# Patient Record
Sex: Female | Born: 1980 | Race: Black or African American | Hispanic: No | Marital: Single | Smoking: Former smoker
Health system: Southern US, Community
[De-identification: ages and names within clinical notes are randomized; demographics above are authoritative.]

## PROBLEM LIST (undated history)

## (undated) DIAGNOSIS — E039 Hypothyroidism, unspecified: Secondary | ICD-10-CM

## (undated) DIAGNOSIS — G4730 Sleep apnea, unspecified: Secondary | ICD-10-CM

## (undated) DIAGNOSIS — E669 Obesity, unspecified: Secondary | ICD-10-CM

## (undated) DIAGNOSIS — J189 Pneumonia, unspecified organism: Secondary | ICD-10-CM

## (undated) DIAGNOSIS — N939 Abnormal uterine and vaginal bleeding, unspecified: Secondary | ICD-10-CM

---

## 2011-09-24 HISTORY — PX: VAGINA SURGERY: SHX829

## 2012-12-07 ENCOUNTER — Encounter (HOSPITAL_COMMUNITY): Payer: Self-pay

## 2012-12-07 ENCOUNTER — Emergency Department (HOSPITAL_COMMUNITY): Payer: Medicaid Other

## 2012-12-07 ENCOUNTER — Inpatient Hospital Stay (HOSPITAL_COMMUNITY)
Admission: EM | Admit: 2012-12-07 | Discharge: 2012-12-12 | DRG: 193 | Disposition: A | Discharge status: Home / Self Care | Payer: Medicaid Other | Attending: Internal Medicine | Admitting: Internal Medicine

## 2012-12-07 DIAGNOSIS — E875 Hyperkalemia: Secondary | ICD-10-CM | POA: Diagnosis present

## 2012-12-07 DIAGNOSIS — G4733 Obstructive sleep apnea (adult) (pediatric): Secondary | ICD-10-CM

## 2012-12-07 DIAGNOSIS — J45901 Unspecified asthma with (acute) exacerbation: Secondary | ICD-10-CM | POA: Diagnosis present

## 2012-12-07 DIAGNOSIS — R600 Localized edema: Secondary | ICD-10-CM

## 2012-12-07 DIAGNOSIS — J9600 Acute respiratory failure, unspecified whether with hypoxia or hypercapnia: Secondary | ICD-10-CM | POA: Diagnosis present

## 2012-12-07 DIAGNOSIS — Z23 Encounter for immunization: Secondary | ICD-10-CM

## 2012-12-07 DIAGNOSIS — R197 Diarrhea, unspecified: Secondary | ICD-10-CM | POA: Diagnosis not present

## 2012-12-07 DIAGNOSIS — Z6841 Body Mass Index (BMI) 40.0 and over, adult: Secondary | ICD-10-CM

## 2012-12-07 DIAGNOSIS — R7989 Other specified abnormal findings of blood chemistry: Secondary | ICD-10-CM

## 2012-12-07 DIAGNOSIS — M7989 Other specified soft tissue disorders: Secondary | ICD-10-CM | POA: Diagnosis present

## 2012-12-07 DIAGNOSIS — J45909 Unspecified asthma, uncomplicated: Secondary | ICD-10-CM

## 2012-12-07 DIAGNOSIS — Z8701 Personal history of pneumonia (recurrent): Secondary | ICD-10-CM

## 2012-12-07 DIAGNOSIS — R1310 Dysphagia, unspecified: Secondary | ICD-10-CM | POA: Diagnosis present

## 2012-12-07 DIAGNOSIS — D72829 Elevated white blood cell count, unspecified: Secondary | ICD-10-CM

## 2012-12-07 DIAGNOSIS — Z79899 Other long term (current) drug therapy: Secondary | ICD-10-CM

## 2012-12-07 DIAGNOSIS — J189 Pneumonia, unspecified organism: Principal | ICD-10-CM | POA: Diagnosis present

## 2012-12-07 DIAGNOSIS — E662 Morbid (severe) obesity with alveolar hypoventilation: Secondary | ICD-10-CM | POA: Diagnosis present

## 2012-12-07 HISTORY — DX: Sleep apnea, unspecified: G47.30

## 2012-12-07 HISTORY — DX: Pneumonia, unspecified organism: J18.9

## 2012-12-07 HISTORY — DX: Abnormal uterine and vaginal bleeding, unspecified: N93.9

## 2012-12-07 HISTORY — DX: Obesity, unspecified: E66.9

## 2012-12-07 NOTE — ED Notes (Signed)
Pt reports SOB x 3 days. States she wears BiPap at night, and last night "it was just hurting my head. I just didn't feel like it was getting in." Pt states chest tightness x 3 days with NP cough x tonight. States hx of pneumonia, states it feels the same. SOB increases when laying flat and with exertion. Pt also reports increased swelling to bilateral legs x 1.5 weeks. Unable to appropriately assess dt pts girth. Pt also reports fall this evening when attempting to go to restroom. Denies LOC, injury. States boyfriend was able to help her stand up.

## 2012-12-07 NOTE — ED Provider Notes (Signed)
CSN: [id number]     Arrival date & time 12/07/12  2058 History   First MD Initiated Contact with Patient 12/07/12 2109     Chief Complaint  Patient presents with  . Shortness of Breath   (Consider location/radiation/quality/duration/timing/severity/associated sxs/prior Treatment) HPI Comments: Patient presents emergency department with chief complaint of shortness of breath. She states that she has had cough, and has been more short of breath than usual. She states that the symptoms have been ongoing for the past 3 days. She states that she normally wears oxygen at home. She also states that she felt like she had a syncopal episode today. She denies any chest pain. Denies fevers or chills. States the once when she felt like this, she was diagnosed with pneumonia.  The history is provided by the patient. No language interpreter was used.    Past Medical History  Diagnosis Date  . Sleep apnea   . Pneumonia   . Obesity    History reviewed. No pertinent past surgical history. Family History  Problem Relation Age of Onset  . Diabetes Mother   . Hypertension Mother   . Hypertension Father   . Diabetes Father    History  Substance Use Topics  . Smoking status: Never Smoker   . Smokeless tobacco: Not on file  . Alcohol Use: No   OB History   Grav Para Term Preterm Abortions TAB SAB Ect Mult Living   1 1        0     Review of Systems  All other systems reviewed and are negative.    Allergies  Fish allergy; Other; and Shrimp  Home Medications   Current Outpatient Rx  Name  Route  Sig  Dispense  Refill  . albuterol (PROVENTIL HFA;VENTOLIN HFA) 108 (90 BASE) MCG/ACT inhaler   Inhalation   Inhale 2 puffs into the lungs daily as needed for wheezing or shortness of breath.         . ferrous sulfate 325 (65 FE) MG tablet   Oral   Take 325 mg by mouth 3 (three) times daily with meals.         . furosemide (LASIX) 20 MG tablet   Oral   Take 20 mg by mouth daily.          . naproxen sodium (ANAPROX) 220 MG tablet   Oral   Take 220 mg by mouth daily as needed (pain).         . norethindrone (AYGESTIN) 5 MG tablet   Oral   Take 5 mg by mouth daily.         Marland Kitchen PRESCRIPTION MEDICATION   Topical   Apply 1 application topically 2 (two) times daily as needed (rash/dryness).         . traMADol (ULTRAM) 50 MG tablet   Oral   Take 150 mg by mouth 2 (two) times daily as needed for pain.         Marland Kitchen VITAMIN E PO   Oral   Take 1 capsule by mouth daily.          BP 131/63  Temp(Src) 98.3 F (36.8 C) (Oral)  Resp 20  Ht 5\' 4"  (1.626 m)  Wt 799 lb (362.424 kg)  BMI 137.08 kg/m2  SpO2 100%  LMP 12/07/2012 Physical Exam  Nursing note and vitals reviewed. Constitutional: She is oriented to person, place, and time. She appears well-developed and well-nourished.  HENT:  Head: Normocephalic and atraumatic.  Eyes: Conjunctivae  and EOM are normal. Pupils are equal, round, and reactive to light.  Neck: Normal range of motion. Neck supple.  Cardiovascular: Normal rate and regular rhythm.  Exam reveals no gallop and no friction rub.   No murmur heard. Pulmonary/Chest: Effort normal and breath sounds normal. No respiratory distress. She has no wheezes. She has no rales. She exhibits no tenderness.  Abdominal: Soft. Bowel sounds are normal. She exhibits no distension and no mass. There is no tenderness. There is no rebound and no guarding.  Musculoskeletal: Normal range of motion. She exhibits no edema and no tenderness.  Neurological: She is alert and oriented to person, place, and time.  Skin: Skin is warm and dry.  Psychiatric: She has a normal mood and affect. Her behavior is normal. Judgment and thought content normal.    ED Course  Procedures (including critical care time) Results for orders placed during the hospital encounter of 12/07/12  CBC WITH DIFFERENTIAL      Result Value Range   WBC 10.7 (*) 4.0 - 10.5 K/uL   RBC 4.47  3.87 - 5.11  MIL/uL   Hemoglobin 12.0  12.0 - 15.0 g/dL   HCT 16.1  09.6 - 04.5 %   MCV 91.3  78.0 - 100.0 fL   MCH 26.8  26.0 - 34.0 pg   MCHC 29.4 (*) 30.0 - 36.0 g/dL   RDW 40.9  81.1 - 91.4 %   Platelets 263  150 - 400 K/uL   Neutrophils Relative % 74  43 - 77 %   Neutro Abs 7.9 (*) 1.7 - 7.7 K/uL   Lymphocytes Relative 17  12 - 46 %   Lymphs Abs 1.8  0.7 - 4.0 K/uL   Monocytes Relative 6  3 - 12 %   Monocytes Absolute 0.6  0.1 - 1.0 K/uL   Eosinophils Relative 3  0 - 5 %   Eosinophils Absolute 0.3  0.0 - 0.7 K/uL   Basophils Relative 1  0 - 1 %   Basophils Absolute 0.1  0.0 - 0.1 K/uL  BASIC METABOLIC PANEL      Result Value Range   Sodium 138  135 - 145 mEq/L   Potassium 4.1  3.5 - 5.1 mEq/L   Chloride 100  96 - 112 mEq/L   CO2 32  19 - 32 mEq/L   Glucose, Bld 86  70 - 99 mg/dL   BUN 11  6 - 23 mg/dL   Creatinine, Ser 7.82  0.50 - 1.10 mg/dL   Calcium 8.9  8.4 - 95.6 mg/dL   GFR calc non Af Amer >90  >90 mL/min   GFR calc Af Amer >90  >90 mL/min  POCT I-STAT TROPONIN I      Result Value Range   Troponin i, poc 0.00  0.00 - 0.08 ng/mL   Comment 3            Dg Chest Port 1 View  12/07/2012   CLINICAL DATA:  Shortness of breath.  EXAM: PORTABLE CHEST - 1 VIEW  COMPARISON:  No priors.  FINDINGS: Film is underpenetrated, limiting the diagnostic sensitivity and specificity the examination. With these limitations in mind, there do appear to be patchy interstitial and multifocal airspace opacities throughout the lungs, most confluent in the right mid to lower lung and at the left base, concerning for potential multilobar pneumonia. Sequela of aspiration could have a similar appearance. No definite scratch at no definite pleural effusions. No evidence of pulmonary edema. Heart  size appears mildly enlarged. The patient is rotated to the left on today's exam, resulting in distortion of the mediastinal contours and reduced diagnostic sensitivity and specificity for mediastinal pathology.   IMPRESSION: 1. Multifocal patchy interstitial and airspace disease in the lung lungs, as above, concerning for multilobar pneumonia or sequela of aspiration. 2. Mild cardiomegaly.   Electronically Signed   By: Trudie Reed M.D.   On: 12/07/2012 22:48    Imaging Review Dg Chest Port 1 View  12/07/2012   CLINICAL DATA:  Shortness of breath.  EXAM: PORTABLE CHEST - 1 VIEW  COMPARISON:  No priors.  FINDINGS: Film is underpenetrated, limiting the diagnostic sensitivity and specificity the examination. With these limitations in mind, there do appear to be patchy interstitial and multifocal airspace opacities throughout the lungs, most confluent in the right mid to lower lung and at the left base, concerning for potential multilobar pneumonia. Sequela of aspiration could have a similar appearance. No definite scratch at no definite pleural effusions. No evidence of pulmonary edema. Heart size appears mildly enlarged. The patient is rotated to the left on today's exam, resulting in distortion of the mediastinal contours and reduced diagnostic sensitivity and specificity for mediastinal pathology.  IMPRESSION: 1. Multifocal patchy interstitial and airspace disease in the lung lungs, as above, concerning for multilobar pneumonia or sequela of aspiration. 2. Mild cardiomegaly.   Electronically Signed   By: Trudie Reed M.D.   On: 12/07/2012 22:48    MDM   1. CAP (community acquired pneumonia)    Morbidly obese patient with cough and shortness of breath. Chest x-ray remarkable for multifocal pneumonia. Will admit the patient to the hospital. I have started Rocephin and azithromycin. Patient has been seen by and discussed with Dr. Criss Alvine, who agrees with the plan.    Roxy Horseman, PA-C 12/08/12 [phone]

## 2012-12-07 NOTE — ED Notes (Signed)
Per EMS: Pt reports SOB when laying flat x 3 days with chest pain tightness. 99% 4L (wears at home). NAD. 149/66. 96 SR. Clear lung sounds. Hx: asthma, sleep apnea

## 2012-12-07 NOTE — ED Notes (Signed)
PA at bedside.

## 2012-12-08 ENCOUNTER — Encounter (HOSPITAL_COMMUNITY): Payer: Self-pay | Admitting: *Deleted

## 2012-12-08 DIAGNOSIS — G4733 Obstructive sleep apnea (adult) (pediatric): Secondary | ICD-10-CM | POA: Diagnosis present

## 2012-12-08 DIAGNOSIS — J45909 Unspecified asthma, uncomplicated: Secondary | ICD-10-CM | POA: Diagnosis present

## 2012-12-08 DIAGNOSIS — R600 Localized edema: Secondary | ICD-10-CM

## 2012-12-08 DIAGNOSIS — D72829 Elevated white blood cell count, unspecified: Secondary | ICD-10-CM

## 2012-12-08 DIAGNOSIS — J189 Pneumonia, unspecified organism: Secondary | ICD-10-CM | POA: Diagnosis present

## 2012-12-08 DIAGNOSIS — R7989 Other specified abnormal findings of blood chemistry: Secondary | ICD-10-CM

## 2012-12-08 DIAGNOSIS — M7989 Other specified soft tissue disorders: Secondary | ICD-10-CM

## 2012-12-08 LAB — CBC WITH DIFFERENTIAL/PLATELET
Basophils Absolute: 0.1 10*3/uL (ref 0.0–0.1)
Basophils Absolute: 0.1 10*3/uL (ref 0.0–0.1)
Basophils Relative: 1 % (ref 0–1)
Eosinophils Absolute: 0.3 10*3/uL (ref 0.0–0.7)
Eosinophils Relative: 3 % (ref 0–5)
HCT: 40.8 % (ref 36.0–46.0)
HCT: 41.6 % (ref 36.0–46.0)
Hemoglobin: 12.5 g/dL (ref 12.0–15.0)
Lymphocytes Relative: 10 % — ABNORMAL LOW (ref 12–46)
Lymphocytes Relative: 17 % (ref 12–46)
Lymphs Abs: 1.3 10*3/uL (ref 0.7–4.0)
MCHC: 29.4 g/dL — ABNORMAL LOW (ref 30.0–36.0)
MCV: 91.3 fL (ref 78.0–100.0)
Monocytes Absolute: 0.6 10*3/uL (ref 0.1–1.0)
Monocytes Absolute: 0.7 10*3/uL (ref 0.1–1.0)
Neutro Abs: 10.7 10*3/uL — ABNORMAL HIGH (ref 1.7–7.7)
Platelets: 263 10*3/uL (ref 150–400)
RBC: 4.56 MIL/uL (ref 3.87–5.11)
RDW: 14.4 % (ref 11.5–15.5)
RDW: 14.5 % (ref 11.5–15.5)
WBC: 10.7 10*3/uL — ABNORMAL HIGH (ref 4.0–10.5)
WBC: 13.1 10*3/uL — ABNORMAL HIGH (ref 4.0–10.5)

## 2012-12-08 LAB — COMPREHENSIVE METABOLIC PANEL
ALT: 19 U/L (ref 0–35)
AST: 17 U/L (ref 0–37)
CO2: 34 mEq/L — ABNORMAL HIGH (ref 19–32)
Chloride: 101 mEq/L (ref 96–112)
Creatinine, Ser: 0.7 mg/dL (ref 0.50–1.10)
GFR calc Af Amer: >90 mL/min (ref >90)
GFR calc non Af Amer: >90 mL/min (ref >90)
Glucose, Bld: 109 mg/dL — ABNORMAL HIGH (ref 70–99)
Total Bilirubin: 0.6 mg/dL (ref 0.3–1.2)

## 2012-12-08 LAB — PRO B NATRIURETIC PEPTIDE: Pro B Natriuretic peptide (BNP): 193.8 pg/mL — ABNORMAL HIGH (ref 0–125)

## 2012-12-08 LAB — BASIC METABOLIC PANEL
Calcium: 8.9 mg/dL (ref 8.4–10.5)
Creatinine, Ser: 0.66 mg/dL (ref 0.50–1.10)
GFR calc Af Amer: >90 mL/min (ref >90)
GFR calc non Af Amer: >90 mL/min (ref >90)
Sodium: 138 mEq/L (ref 135–145)

## 2012-12-08 LAB — D-DIMER, QUANTITATIVE: D-Dimer, Quant: 1.6 ug/mL-FEU — ABNORMAL HIGH (ref 0.00–0.48)

## 2012-12-08 LAB — POCT I-STAT TROPONIN I: Troponin i, poc: 0 ng/mL (ref 0.00–0.08)

## 2012-12-08 LAB — LEGIONELLA ANTIGEN, URINE

## 2012-12-08 LAB — CBC
HCT: 41.5 % (ref 36.0–46.0)
Hemoglobin: 12.7 g/dL (ref 12.0–15.0)
MCV: 90.2 fL (ref 78.0–100.0)
RBC: 4.6 MIL/uL (ref 3.87–5.11)
WBC: 15.3 10*3/uL — ABNORMAL HIGH (ref 4.0–10.5)

## 2012-12-08 MED ORDER — PATIENT'S GUIDE TO USING COUMADIN BOOK
Freq: Once | Status: DC
  Filled 2012-12-08: qty 1

## 2012-12-08 MED ORDER — ACETAMINOPHEN 325 MG PO TABS
650.0000 mg | ORAL_TABLET | Freq: Four times a day (QID) | ORAL | Status: DC | PRN
  Administered 2012-12-09 – 2012-12-11 (×2): 650 mg via ORAL
  Filled 2012-12-08 (×2): qty 2

## 2012-12-08 MED ORDER — ALBUTEROL SULFATE HFA 108 (90 BASE) MCG/ACT IN AERS: 2.0000 | INHALATION_SPRAY | Freq: Every day | RESPIRATORY_TRACT | Status: DC | PRN

## 2012-12-08 MED ORDER — METHYLPREDNISOLONE SODIUM SUCC 125 MG IJ SOLR
60.0000 mg | Freq: Two times a day (BID) | INTRAMUSCULAR | Status: DC
  Administered 2012-12-08 – 2012-12-10 (×4): 60 mg via INTRAVENOUS
  Filled 2012-12-08 (×5): qty 0.96
  Filled 2012-12-08: qty 2
  Filled 2012-12-08: qty 0.96

## 2012-12-08 MED ORDER — FERROUS SULFATE 325 (65 FE) MG PO TABS
325.0000 mg | ORAL_TABLET | Freq: Three times a day (TID) | ORAL | Status: DC
  Administered 2012-12-08 – 2012-12-12 (×14): 325 mg via ORAL
  Filled 2012-12-08 (×16): qty 1

## 2012-12-08 MED ORDER — PATIENT'S GUIDE TO USING COUMADIN BOOK
Freq: Once | Status: DC
  Administered 2012-12-08: 15:00:00
  Filled 2012-12-08: qty 1

## 2012-12-08 MED ORDER — DEXTROSE 5 % IV SOLN
1.0000 g | INTRAVENOUS | Status: DC
  Administered 2012-12-08 – 2012-12-09 (×2): 1 g via INTRAVENOUS
  Filled 2012-12-08 (×3): qty 10

## 2012-12-08 MED ORDER — ALBUTEROL SULFATE (5 MG/ML) 0.5% IN NEBU
2.5000 mg | INHALATION_SOLUTION | RESPIRATORY_TRACT | Status: DC
  Administered 2012-12-08 – 2012-12-11 (×15): 2.5 mg via RESPIRATORY_TRACT
  Filled 2012-12-08 (×23): qty 0.5

## 2012-12-08 MED ORDER — ENOXAPARIN SODIUM 150 MG/ML ~~LOC~~ SOLN
300.0000 mg | Freq: Two times a day (BID) | SUBCUTANEOUS | Status: DC
  Filled 2012-12-08 (×2): qty 2

## 2012-12-08 MED ORDER — POTASSIUM CHLORIDE CRYS ER 20 MEQ PO TBCR
40.0000 meq | EXTENDED_RELEASE_TABLET | Freq: Every day | ORAL | Status: DC
Start: 2012-12-09 — End: 2012-12-09
  Filled 2012-12-08: qty 2

## 2012-12-08 MED ORDER — TRAMADOL HCL 50 MG PO TABS
100.0000 mg | ORAL_TABLET | Freq: Four times a day (QID) | ORAL | Status: DC | PRN
  Administered 2012-12-08: 100 mg via ORAL

## 2012-12-08 MED ORDER — TRAMADOL HCL 50 MG PO TABS
150.0000 mg | ORAL_TABLET | Freq: Two times a day (BID) | ORAL | Status: DC | PRN
  Filled 2012-12-08: qty 3

## 2012-12-08 MED ORDER — FUROSEMIDE 40 MG PO TABS: 40.0000 mg | ORAL_TABLET | Freq: Every day | ORAL | Status: DC

## 2012-12-08 MED ORDER — DEXTROSE 5 % IV SOLN
500.0000 mg | Freq: Once | INTRAVENOUS | Status: AC
  Administered 2012-12-08: 500 mg via INTRAVENOUS
  Filled 2012-12-08: qty 500

## 2012-12-08 MED ORDER — ENOXAPARIN SODIUM 150 MG/ML ~~LOC~~ SOLN
300.0000 mg | Freq: Two times a day (BID) | SUBCUTANEOUS | Status: DC
  Administered 2012-12-08 – 2012-12-10 (×4): 300 mg via SUBCUTANEOUS
  Filled 2012-12-08 (×6): qty 2

## 2012-12-08 MED ORDER — FUROSEMIDE 10 MG/ML IJ SOLN
40.0000 mg | Freq: Two times a day (BID) | INTRAMUSCULAR | Status: DC
  Administered 2012-12-08 – 2012-12-11 (×5): 40 mg via INTRAVENOUS
  Filled 2012-12-08 (×7): qty 4

## 2012-12-08 MED ORDER — WARFARIN VIDEO: Freq: Once | Status: DC

## 2012-12-08 MED ORDER — ALBUTEROL SULFATE (5 MG/ML) 0.5% IN NEBU: 2.5000 mg | INHALATION_SOLUTION | RESPIRATORY_TRACT | Status: DC | PRN

## 2012-12-08 MED ORDER — AZITHROMYCIN 500 MG PO TABS
500.0000 mg | ORAL_TABLET | Freq: Every day | ORAL | Status: AC
  Administered 2012-12-09 – 2012-12-10 (×2): 500 mg via ORAL
  Filled 2012-12-08 (×2): qty 1

## 2012-12-08 MED ORDER — DEXTROSE 5 % IV SOLN: 500.0000 mg | INTRAVENOUS | Status: DC

## 2012-12-08 MED ORDER — FUROSEMIDE 20 MG PO TABS
20.0000 mg | ORAL_TABLET | Freq: Every day | ORAL | Status: DC
  Administered 2012-12-08: 12:00:00 20 mg via ORAL
  Filled 2012-12-08: qty 1

## 2012-12-08 MED ORDER — ENOXAPARIN (LOVENOX) PATIENT EDUCATION KIT
PACK | Freq: Once | Status: DC
  Filled 2012-12-08: qty 1

## 2012-12-08 MED ORDER — NORETHINDRONE ACETATE 5 MG PO TABS
5.0000 mg | ORAL_TABLET | Freq: Every day | ORAL | Status: DC
  Filled 2012-12-08: qty 1

## 2012-12-08 MED ORDER — DEXTROSE 5 % IV SOLN
1.0000 g | Freq: Once | INTRAVENOUS | Status: AC
  Administered 2012-12-08: 1 g via INTRAVENOUS
  Filled 2012-12-08: qty 10

## 2012-12-08 MED ORDER — IPRATROPIUM BROMIDE 0.02 % IN SOLN
0.5000 mg | RESPIRATORY_TRACT | Status: DC
  Administered 2012-12-08 – 2012-12-11 (×15): 0.5 mg via RESPIRATORY_TRACT
  Filled 2012-12-08 (×23): qty 2.5

## 2012-12-08 MED ORDER — FUROSEMIDE 20 MG PO TABS
20.0000 mg | ORAL_TABLET | Freq: Once | ORAL | Status: DC
  Administered 2012-12-08: 20 mg via ORAL
  Filled 2012-12-08: qty 1

## 2012-12-08 MED ORDER — HEPARIN SODIUM (PORCINE) 5000 UNIT/ML IJ SOLN
5000.0000 [IU] | Freq: Three times a day (TID) | INTRAMUSCULAR | Status: DC
  Administered 2012-12-08: 09:00:00 5000 [IU] via SUBCUTANEOUS
  Filled 2012-12-08 (×4): qty 1

## 2012-12-08 NOTE — Progress Notes (Addendum)
[facility] PROGRESS NOTE  Isabel Roberts NFA:[id number] DOB: 1981-03-11 DOA: 12/07/2012 PCP: [name], MD  Assessment/Plan  Acute hypoxic respiratory failure. Differential diagnosis includes community acquired pneumonia, pulmonary edema, pulmonary embolism.  She has had lower extremity swelling suggesting right vs. Left heart failure.  Pro-BNP often falsely low in setting of obesity. D-dimer moderately elevated.  Unable to complete confirmatory testing due to body habitus.  Spoke with [facility] who do not have equipment to perform CT or VQ for weight > 550lbs.    CAP -  S. pneumo and legionella neg -  Continue azithromycin 500mg  daily x 3 days -  Continue ceftriaxone  Acute asthma exacerbation -  Continue solumedrol today and wean to prednisone tomorrow if tolerated -  Continue duonebs q4h and add albuterol neb q2h prn  Elevated D-dimer -  Start empiric A/C for minimum of 3 months -  Lovenox teaching -  Coumadin teaching -  INR to be managed by Dr. Redmond School, her PCP -  Home health RN to draw INR/CBC and fax to Dr. Pilar Grammes office, assist with lovenox injections -  D/c birth control  OSA/OHS -  Continue nightly bipap  Lower extremity swelling.  DDx includes diastolic heart failure, right ventricular failure secondary to asthma, OHS/OSA, dvt -  ECHO  -  Lower extremity duplex -  Strict I/O -  Lasix 40mg  IV BID   Morbid obesity -  Nursing staff and others working on obtaining bedside commode and rolling walker for patient to use -  Nutrition consultation -  Once bedside commode established, please d/c foley -  PT evaluation  Hx of vaginal bleeding -  Monitor clinically -  CBC monitoring   Leukocytosis, rising likely secondary to steroids -  Trend  Diet:  Dysphagia 3 with thin liquids Access:  PIV IVF:  OFF Proph:  Therapeutic lovenox  Code Status: full Family Communication: patient alone Disposition Plan: pending improvement in  dyspnea   Consultants:  Pharmacy  Procedures:  CXR  Duplex lower extremity  Antibiotics:  Ceftriaxone 9/15  Azithromycin 9/15   HPI/Subjective:  Patient states that her breathing has become much easier since she came to the emergency department.  She is now back to baseline, however she has less wheeze and less shortness of breath.  Objective: Filed Vitals:   12/08/12 0400 12/08/12 0510 12/08/12 0638 12/08/12 1333  BP:  133/49    Pulse:  99    Temp:  98.5 F (36.9 C)    TempSrc:  Oral    Resp:  22    Height:  5\' 4"  (1.626 m)    Weight:      SpO2: 94% 94% 98% 97%    Intake/Output Summary (Last 24 hours) at 12/08/12 1438 Last data filed at 12/08/12 1300  Gross per 24 hour  Intake    740 ml  Output    400 ml  Net    340 ml   Filed Weights   12/07/12 2212  Weight: 362.424 kg (799 lb)    Exam:   General:  Morbidly obese African American female, No acute distress, nasal cannula in place  HEENT:  NCAT, MMM  Cardiovascular:  RRR, nl S1, S2 no mrg, 2+ pulses, warm extremities  Respiratory:  Expiratory wheeze, diminished, and no audible focal rales or rhonchi, no increased WOB  Abdomen:   NABS, soft, NT/ND  MSK:   Normal tone and bulk, 1+ bilateral LEE  Neuro:  Grossly intact  Data Reviewed: Basic Metabolic Panel:  Recent Labs Lab 12/08/12 0005 12/08/12 0740  NA 138 140  K 4.1 4.2  CL 100 101  CO2 32 34*  GLUCOSE 86 109*  BUN 11 11  CREATININE 0.66 0.70  CALCIUM 8.9 9.1   Liver Function Tests:  Recent Labs Lab 12/08/12 0740  AST 17  ALT 19  ALKPHOS 148*  BILITOT 0.6  PROT 7.3  ALBUMIN 3.5   No results found for this basename: LIPASE, AMYLASE,  in the last 168 hours No results found for this basename: AMMONIA,  in the last 168 hours CBC:  Recent Labs Lab 12/08/12 0005 12/08/12 0740  WBC 10.7* 13.1*  NEUTROABS 7.9* 10.7*  HGB 12.0 12.5  HCT 40.8 41.6  MCV 91.3 91.2  PLT 263 246   Cardiac Enzymes: No results found for  this basename: CKTOTAL, CKMB, CKMBINDEX, TROPONINI,  in the last 168 hours BNP (last 3 results)  Recent Labs  12/08/12 0005  PROBNP 193.8*   CBG: No results found for this basename: GLUCAP,  in the last 168 hours  No results found for this or any previous visit (from the past 240 hour(s)).   Studies: Dg Chest Port 1 View  12/07/2012   CLINICAL DATA:  Shortness of breath.  EXAM: PORTABLE CHEST - 1 VIEW  COMPARISON:  No priors.  FINDINGS: Film is underpenetrated, limiting the diagnostic sensitivity and specificity the examination. With these limitations in mind, there do appear to be patchy interstitial and multifocal airspace opacities throughout the lungs, most confluent in the right mid to lower lung and at the left base, concerning for potential multilobar pneumonia. Sequela of aspiration could have a similar appearance. No definite scratch at no definite pleural effusions. No evidence of pulmonary edema. Heart size appears mildly enlarged. The patient is rotated to the left on today's exam, resulting in distortion of the mediastinal contours and reduced diagnostic sensitivity and specificity for mediastinal pathology.  IMPRESSION: 1. Multifocal patchy interstitial and airspace disease in the lung lungs, as above, concerning for multilobar pneumonia or sequela of aspiration. 2. Mild cardiomegaly.   Electronically Signed   By: Trudie Reed M.D.   On: 12/07/2012 22:48    Scheduled Meds: . ipratropium  0.5 mg Nebulization Q4H   And  . albuterol  2.5 mg Nebulization Q4H  . [START ON 12/09/2012] azithromycin  500 mg Oral Daily  . cefTRIAXone (ROCEPHIN)  IV  1 g Intravenous Q24H  . enoxaparin (LOVENOX) injection  300 mg Subcutaneous Q12H  . ferrous sulfate  325 mg Oral TID WC  . furosemide  40 mg Intravenous BID  . methylPREDNISolone (SOLU-MEDROL) injection  60 mg Intravenous Q12H  . patient's guide to using coumadin book   Does not apply Once  . [START ON 12/09/2012] potassium chloride   40 mEq Oral Daily  . warfarin   Does not apply Once   Continuous Infusions:   Principal Problem:   CAP (community acquired pneumonia) Active Problems:   Morbid obesity   OSA (obstructive sleep apnea)   Asthma    Time spent: 30 min    Kimoni Pickerill, [facility]  [facility] Pager [phone]. If 7PM-7AM, please contact night-coverage at [email or web address], password [facility] 12/08/2012, 2:38 PM  LOS: 1 day

## 2012-12-08 NOTE — Evaluation (Signed)
Clinical/Bedside Swallow Evaluation Patient Details  Name: Isabel Roberts MRN: [id number] Date of Birth: November 03, 1980  Today's Date: 12/08/2012 Time: 5366-4403 SLP Time Calculation (min): 45 min  Past Medical History:  Past Medical History  Diagnosis Date  . Sleep apnea   . Pneumonia   . Obesity   . Vaginal bleeding    Past Surgical History: History reviewed. No pertinent past surgical history. HPI:  Pt is a 32 y.o. female with Past medical history of morbid obesity, obstructive sleep apnea on BiPAP at home, recurrent pneumonias, history of asthma as a child on inhalers she presented today with the complaint of shortness of breath that has been progressively worsening since last few days. She mentions that she normally wears BiPAP along with oxygen but since last 3 days due to shortness of breath she has been using her oxygen even without BiPAP during days. She denies any chest pain or fever or chills but does mention that she has cough along with sputum expectoration which he mentions white to yellow without any blood. She felt nauseous but denies any vomiting or abdominal pain. She denies any urinary symptoms or constipation. She feels that her legs are more swollen than her usual. She mentions that she has been ambulating at home appropriately with a walker but since last few days has been having lethargy and had almost fall when she felt dizzy and lightheaded today. She denies any trauma to her head or neck. She denies any episode of loss of consciousness. CXR 12/07/12 is concerning for  multilobar pneumonia and a bedside swallowing evaluation was ordered.   Assessment / Plan / Recommendation Clinical Impression  Pt presents with an intermittent, delayed throat clear during PO trials which may be secondary to pt's reported h/o reflux. Eructation also noted, and vocal quality remains clear throughout. Overt coughing x1 observed immediately following a straw sip of water with pt reporting, "that's  my fault, I tried to start talking while I was swallowing." Pt's oropharyngeal function otherwise appears grossly WFL, however SLP provided education regarding pt's respiratory status and positioning (due to size) may increase her risk of possible penetration/aspiration. Recommend to initiate a Dys. 3 diet for energy conservation with thin liquids with frequent breaks due to SOB. Patient positioning will be challenging although very important for safe swallowing; pt must be completely upright for all PO intake.    Aspiration Risk  Mild    Diet Recommendation Dysphagia 3 (Mechanical Soft);Thin liquid   Liquid Administration via: Cup;Straw;No straw Medication Administration: Whole meds with liquid Supervision: Patient able to self feed;Intermittent supervision to cue for compensatory strategies Compensations: Slow rate;Small sips/bites;Follow solids with liquid Postural Changes and/or Swallow Maneuvers: Seated upright 90 degrees;Upright 30-60 min after meal    Other  Recommendations Oral Care Recommendations: Oral care BID   Follow Up Recommendations  Outpatient SLP    Frequency and Duration min 2x/week  2 weeks   Pertinent Vitals/Pain N/A    SLP Swallow Goals Patient will utilize recommended strategies during swallow to increase swallowing safety with: Modified independent assistance   Swallow Study Prior Functional Status       General Date of Onset: 12/07/12 HPI: Pt is a 32 y.o. female with Past medical history of morbid obesity, obstructive sleep apnea on BiPAP at home, recurrent pneumonias, history of asthma as a child on inhalers she presented today with the complaint of shortness of breath that has been progressively worsening since last few days. She mentions that she normally wears BiPAP along with  oxygen but since last 3 days due to shortness of breath she has been using her oxygen even without BiPAP during days. She denies any chest pain or fever or chills but does mention  that she has cough along with sputum expectoration which he mentions white to yellow without any blood. She felt nauseous but denies any vomiting or abdominal pain. She denies any urinary symptoms or constipation. She feels that her legs are more swollen than her usual. She mentions that she has been ambulating at home appropriately with a walker but since last few days has been having lethargy and had almost fall when she felt dizzy and lightheaded today. She denies any trauma to her head or neck. She denies any episode of loss of consciousness. CXR 12/07/12 is concerning for  multilobar pneumonia and a bedside swallowing evaluation was ordered. Type of Study: Bedside swallow evaluation Previous Swallow Assessment: none in chart Diet Prior to this Study: NPO Temperature Spikes Noted: No Respiratory Status: Other (comment) (pt on BiPAP, removed herself for evaluation) History of Recent Intubation: No Behavior/Cognition: Alert;Cooperative;Pleasant mood Oral Cavity - Dentition: Adequate natural dentition Self-Feeding Abilities: Able to feed self Patient Positioning: Upright in bed Baseline Vocal Quality: Clear Volitional Cough: Strong Volitional Swallow: Able to elicit    Oral/Motor/Sensory Function Overall Oral Motor/Sensory Function: Appears within functional limits for tasks assessed   Ice Chips Ice chips: Not tested   Thin Liquid Thin Liquid: Impaired Presentation: Cup;Self Fed;Straw Pharyngeal  Phase Impairments: Throat Clearing - Delayed;Cough - Immediate    Nectar Thick Nectar Thick Liquid: Not tested   Honey Thick Honey Thick Liquid: Not tested   Puree Puree: Impaired Presentation: Self Fed;Spoon Pharyngeal Phase Impairments: Throat Clearing - Delayed   Solid   GO Functional Assessment Tool Used: clinical judgment Functional Limitations: Swallowing Swallow Current Status (B1478): At least 20 percent but less than 40 percent impaired, limited or restricted Swallow Goal Status  [phone]): At least 1 percent but less than 20 percent impaired, limited or restricted  Solid: Within functional limits Presentation: Self Georjean Mode 12/08/2012,10:48 AM   Maxcine Ham, M.A. CCC-SLP [phone]

## 2012-12-08 NOTE — Progress Notes (Addendum)
ANTICOAGULATION CONSULT NOTE - Initial Consult  Pharmacy Consult for Lovenox Indication:  Elevated D-Dimer - Possible DVT/PE  Allergies  Allergen Reactions  . Fish Allergy Hives and Other (See Comments)    Throat swells  . Other Other (See Comments)    Grass causes a rash  . Shrimp [Shellfish Allergy] Rash   Patient Measurements: Weight - 362.4 kg  Vital Signs: Temp: 98.5 F (36.9 C) (09/15 0510) Temp src: Oral (09/15 0510) BP: 133/49 mmHg (09/15 0510) Pulse Rate: 99 (09/15 0510)  Labs:  Recent Labs  12/08/12 0005 12/08/12 0740  HGB 12.0 12.5  HCT 40.8 41.6  PLT 263 246  CREATININE 0.66 0.70   Estimated Creatinine Clearance: 288.6 ml/min (by C-G formula based on Cr of 0.7).  Medical History: Past Medical History  Diagnosis Date  . Sleep apnea   . Pneumonia   . Obesity   . Vaginal bleeding    Assessment: 32 yo female with c/o shortness of breath and swelling in the lower extremities.  She has an elevated D-Dimer and is being evaluated for possible DVT/PE.  We have been asked to initiate SQ LMWH for her until work up is complete.  She has been on Aygestin which has the potential to increase her risk of thrombosis.  This has now been discontinued.  She is morbidly obese with an estimated weight of 362 kg.  She has some ongoing vaginal bleeding and we will need to watch this closely.  Typical dosing does not require a cap for weight, however, we will initiate therapy with 300 mg LMWH every 12 hours and draw a 4 hour anti-Xa level to see if she falls within desired goal range.  Her CBC is stable as well as her platelets.  Her renal function is also stable.  Please note: Norethindrone Acetate ( Aygestin) Contraindicated in Cardiovascular Disorders and Venous Thromboembolism The FDA approved safety labeling revisions for norethindrone acetate tablets ( Aygestin; [employer]) to reflect additional contraindications for use. Norethindrone is contraindicated  in women with known, suspected, or a history of breast cancer and in those with active deep vein thrombosis, pulmonary embolism, or a history of these conditions. Women who have active arterial thromboembolic disease or have had a thromboembolic event such as a stroke or myocardial infarction within the past year should not receive norethindrone therapy. Also, patients with risk factors for arterial vascular disease (eg, hypertension, diabetes mellitus, tobacco use, hypercholesterolemia, and obesity) and/or venous thromboembolism (eg, personal or family history of the condition, obesity, and systemic lupus erythematosus) should be treated appropriately. Norethindrone tablets are indicated for the treatment of secondary amenorrhea, endometriosis, and abnormal uterine bleeding from hormonal imbalance in the absence of organic pathologic causes, such as submucous fibroids or uterine cancer.   Goal of Therapy:  Anti-Xa level 0.6-1.2 units/ml 4hrs after LMWH dose given Monitor platelets by anticoagulation protocol: Yes   Plan:  Lovenox 300mg  SQ q12 hours Obtain a 4 hour Anti-Xa level and adjust dose if she is not within desired goal range.  Nadara Mustard, PharmD., MS Clinical Pharmacist Pager:  [phone] Thank you for allowing pharmacy to be part of this patients care team. 12/08/2012,2:15 PM

## 2012-12-08 NOTE — Progress Notes (Signed)
VASCULAR LAB PRELIMINARY  PRELIMINARY  PRELIMINARY  PRELIMINARY  Bilateral lower extremity venous attempted.   Inconclusive due to limitations of ultrasound transmission and patients body habitus.    Preliminary report:    Florestine Avers, RVT 12/08/2012, 5:29 PM

## 2012-12-08 NOTE — ED Notes (Signed)
RT notified of breathing treatments ordered for 4am

## 2012-12-08 NOTE — Progress Notes (Signed)
MEDICATION RELATED NOTE   Pharmacy Consult for Renal Dose Adjustment of ABX  Allergies  Allergen Reactions  . Fish Allergy Hives and Other (See Comments)    Throat swells  . Other Other (See Comments)    Grass causes a rash  . Shrimp [Shellfish Allergy] Rash   Patient Measurements: [facility] Weights   12/07/12 2212  Weight: 799 lb (362.424 kg)   Vital Signs: Temp: 98.5 F (36.9 C) (09/15 0510) Temp src: Oral (09/15 0510) BP: 133/49 mmHg (09/15 0510) Pulse Rate: 99 (09/15 0510)  Labs:  Recent Labs  12/08/12 0005 12/08/12 0740  WBC 10.7* 13.1*  HGB 12.0 12.5  HCT 40.8 41.6  PLT 263 246  CREATININE 0.66 0.70  ALBUMIN  --  3.5  PROT  --  7.3  AST  --  17  ALT  --  19  ALKPHOS  --  148*  BILITOT  --  0.6   Estimated Creatinine Clearance: 288.6 ml/min (by C-G formula based on Cr of 0.7).   Microbiology: No results found for this or any previous visit (from the past 720 hour(s)).  Medications:  Anti-infectives   Start     Dose/Rate Route Frequency Ordered Stop   12/09/12 0000  azithromycin (ZITHROMAX) 500 mg in dextrose 5 % 250 mL IVPB     500 mg 250 mL/hr over 60 Minutes Intravenous Every 24 hours 12/08/12 0502 12/15/12 2359   12/08/12 2200  cefTRIAXone (ROCEPHIN) 1 g in dextrose 5 % 50 mL IVPB     1 g 100 mL/hr over 30 Minutes Intravenous Every 24 hours 12/08/12 0502 12/15/12 2159   12/08/12 0045  cefTRIAXone (ROCEPHIN) 1 g in dextrose 5 % 50 mL IVPB     1 g 100 mL/hr over 30 Minutes Intravenous  Once 12/08/12 0032 12/08/12 0113   12/08/12 0045  azithromycin (ZITHROMAX) 500 mg in dextrose 5 % 250 mL IVPB     500 mg 250 mL/hr over 60 Minutes Intravenous  Once 12/08/12 0032 12/08/12 0256      Assessment: 32 yo female admitted with complaints of shortness of breath.  She has a PMH significant for morbid obesity, OSA which requires the use of a BiPAP.  She has recurrent respiratory infections and a history of asthma with inhaler use.  She is currently on 4L [place].   She is receiving IV Ceftriaxone and Azithromycin for possible pneumonia per cxr.  We have been asked to evaluate her regimen for possible dose adjustments based on her renal function.  Goal of Therapy:  Therapeutic response to IV antibiotics  Plan:  1.  Continue current antibiotics as ordered - dose is appropriate for renal function. 2.  Consider changing antibiotics based on culture data and sensitivity when available.  Nadara Mustard, PharmD., MS Clinical Pharmacist Pager:  [phone] Thank you for allowing pharmacy to be part of this patients care team. 12/08/2012,11:54 AM

## 2012-12-08 NOTE — H&P (Signed)
[facility] History and Physical  Patient: Isabel Roberts  ZOX:[id number]  DOB: 05-23-1980  DOA: 12/07/2012  Referring physician: Dr. Criss Alvine PCP: [name], MD  Consults:   non-  Chief Complaint: Cough with shortness of breath  HPI: Isabel Roberts is a 32 y.o. female with Past medical history of morbid obesity, obstructive sleep apnea on BiPAP at home, recurrent pneumonias, history of asthma as a child on inhalers she presented today with the complaint of shortness of breath that has been progressively worsening since last few days. She mentions that she normally wears BiPAP along with oxygen but since last 3 days due to shortness of breath she has been using her oxygen even without BiPAP during days. She denies any chest pain or fever or chills but does mention that she has cough along with sputum expectoration which he mentions white to yellow without any blood. She felt nauseous but denies any vomiting or abdominal pain. She denies any urinary symptoms or constipation. She feels that her legs are more swollen than her usual. She mentions that she has been ambulating at home appropriately with a walker but since last few days has been having lethargy and had almost fall when she felt dizzy and lightheaded today. She denies any trauma to her head or neck. She denies any episode of loss of consciousness.  Review of Systems: as mentioned in the history of present illness.  A Comprehensive review of the other systems is negative.  Past Medical History  Diagnosis Date  . Sleep apnea   . Pneumonia   . Obesity    History reviewed. No pertinent past surgical history. Social History:  reports that she has never smoked. She does not have any smokeless tobacco history on file. She reports that she does not drink alcohol or use illicit drugs. Patient is coming from home. Independent for most of her  ADL.  Allergies  Allergen Reactions  . Fish Allergy Hives and Other (See Comments)     Throat swells  . Other Other (See Comments)    Grass causes a rash  . Shrimp [Shellfish Allergy] Rash    Family History  Problem Relation Age of Onset  . Diabetes Mother   . Hypertension Mother   . Hypertension Father   . Diabetes Father     Prior to Admission medications   Medication Sig Start Date End Date Taking? Authorizing Provider  albuterol (PROVENTIL HFA;VENTOLIN HFA) 108 (90 BASE) MCG/ACT inhaler Inhale 2 puffs into the lungs daily as needed for wheezing or shortness of breath.   Yes Historical Provider, MD  ferrous sulfate 325 (65 FE) MG tablet Take 325 mg by mouth 3 (three) times daily with meals.   Yes Historical Provider, MD  furosemide (LASIX) 20 MG tablet Take 20 mg by mouth daily.   Yes Historical Provider, MD  naproxen sodium (ANAPROX) 220 MG tablet Take 220 mg by mouth daily as needed (pain).   Yes Historical Provider, MD  norethindrone (AYGESTIN) 5 MG tablet Take 5 mg by mouth daily.   Yes Historical Provider, MD  PRESCRIPTION MEDICATION Apply 1 application topically 2 (two) times daily as needed (rash/dryness).   Yes Historical Provider, MD  traMADol (ULTRAM) 50 MG tablet Take 150 mg by mouth 2 (two) times daily as needed for pain.   Yes Historical Provider, MD  VITAMIN E PO Take 1 capsule by mouth daily.   Yes Historical Provider, MD    Physical Exam: Filed Vitals:   12/07/12 2212 12/07/12 2245 12/08/12  0008 12/08/12 0400  BP:  104/64 137/65   Pulse:  83 91   Temp:      TempSrc:      Resp:  28 25   Height: 5\' 4"  (1.626 m)     Weight: 362.424 kg (799 lb)     SpO2:  98% 97% 94%    General: Alert, Awake and Oriented to Time, Place and Person. Appear in moderate distress Eyes: PERRL ENT: Oral Mucosa clear moist. Neck: Difficult to assess  JVD, no  Carotid Bruits  Cardiovascular: S1 and S2 Present, no  Murmur, Peripheral Pulses Present Respiratory: Bilateral Air entry equal and Decreased, bilateral faint distant  Crackles,expiratory wheezes in the neck and  upper chest  Abdomen: Bowel Sound Present, Soft and Non tender Skin: No  Rash Extremities: Bilateral  Pedal edema, no  calf tenderness Neurologic: Grossly Unremarkable.  Labs on Admission:  CBC:  Recent Labs Lab 12/08/12 0005  WBC 10.7*  NEUTROABS 7.9*  HGB 12.0  HCT 40.8  MCV 91.3  PLT 263    CMP     Component Value Date/Time   NA 138 12/08/2012 0005   K 4.1 12/08/2012 0005   CL 100 12/08/2012 0005   CO2 32 12/08/2012 0005   GLUCOSE 86 12/08/2012 0005   BUN 11 12/08/2012 0005   CREATININE 0.66 12/08/2012 0005   CALCIUM 8.9 12/08/2012 0005   GFRNONAA >90 12/08/2012 0005   GFRAA >90 12/08/2012 0005    No results found for this basename: LIPASE, AMYLASE,  in the last 168 hours No results found for this basename: AMMONIA,  in the last 168 hours  Cardiac Enzymes: No results found for this basename: CKTOTAL, CKMB, CKMBINDEX, TROPONINI,  in the last 168 hours  BNP (last 3 results)  Recent Labs  12/08/12 0005  PROBNP 193.8*    Radiological Exams on Admission: Dg Chest Port 1 View  12/07/2012   CLINICAL DATA:  Shortness of breath.  EXAM: PORTABLE CHEST - 1 VIEW  COMPARISON:  No priors.  FINDINGS: Film is underpenetrated, limiting the diagnostic sensitivity and specificity the examination. With these limitations in mind, there do appear to be patchy interstitial and multifocal airspace opacities throughout the lungs, most confluent in the right mid to lower lung and at the left base, concerning for potential multilobar pneumonia. Sequela of aspiration could have a similar appearance. No definite scratch at no definite pleural effusions. No evidence of pulmonary edema. Heart size appears mildly enlarged. The patient is rotated to the left on today's exam, resulting in distortion of the mediastinal contours and reduced diagnostic sensitivity and specificity for mediastinal pathology.  IMPRESSION: 1. Multifocal patchy interstitial and airspace disease in the lung lungs, as above,  concerning for multilobar pneumonia or sequela of aspiration. 2. Mild cardiomegaly.   Electronically Signed   By: Trudie Reed M.D.   On: 12/07/2012 22:48   Assessment/Plan Principal Problem:   CAP (community acquired pneumonia) Active Problems:   Morbid obesity   OSA (obstructive sleep apnea)   Asthma   1. CAP (community acquired pneumonia) The patient had a complaint of cough and shortness of breath. She underwent a chest x-ray that is suggestive of multilobar pneumonia. She is on 4 L of oxygen which is her baseline. She appears in respiratory distress. She does have history of asthma as a child and is on using inhalers. She appears to have expiratory wheezes on examination as well. With this I would admit the patient and treat her with IV ceftriaxone  and IV Zithromax. Considering her history of asthma and respiratory distress I would also treat her with IV Solu-Medrol and DuoNeb's. Blood cultures and sputum cultures are obtained, urine antigens will be ordered. Patient will be admitted to telemetry.  2. Morbid obesity bariatric bed has been arranged  3. Obstructive sleep apnea Patient will be placed on BiPAP at her home settings.  DVT Prophylaxis: subcutaneous Heparin Nutrition: As tolerated   Code Status: Full   Disposition: Admitted to inpatient in telemetry.  Author: Lynden Oxford, MD [facility] Pager: [phone] 12/08/2012, 5:02 AM    If 7PM-7AM, please contact night-coverage [email or web address] Password [email or web address]

## 2012-12-08 NOTE — ED Provider Notes (Signed)
Medical screening examination/treatment/procedure(s) were conducted as a shared visit with non-physician practitioner(s) and myself.  I personally evaluated the patient during the encounter   Patient massively obese with dyspnea and cough with subjective fevers. CXR c/w multifocal PNA, will treat with CAP abx. Stable for non-ICU admission, airway stable.  Angiocath insertion Performed by: Pricilla Loveless T  Consent: Verbal consent obtained. Risks and benefits: risks, benefits and alternatives were discussed Time out: Immediately prior to procedure a "time out" was called to verify the correct patient, procedure, equipment, support staff and site/side marked as required.  Preparation: Patient was prepped and draped in the usual sterile fashion.  Vein Location: right forearm  Ultrasound Guided  Gauge: 20  Normal blood return and flush without difficulty Patient tolerance: Patient tolerated the procedure well with no immediate complications.     Audree Camel, MD 12/08/12 [phone]

## 2012-12-08 NOTE — Progress Notes (Signed)
ANTICOAGULATION CONSULT NOTE - f/u Consult  Pharmacy Consult for Lovenox Indication:  Elevated D-Dimer - Possible DVT/PE  Allergies  Allergen Reactions  . Fish Allergy Hives and Other (See Comments)    Throat swells  . Other Other (See Comments)    Grass causes a rash  . Shrimp [Shellfish Allergy] Rash   Patient Measurements: Weight - 362.4 kg  Vital Signs: Temp: 98.6 F (37 C) (09/15 2017) Temp src: Oral (09/15 2017) BP: 129/61 mmHg (09/15 2017) Pulse Rate: 98 (09/15 2022)  Labs:  Recent Labs  12/08/12 0005 12/08/12 0740 12/08/12 1643  HGB 12.0 12.5 12.7  HCT 40.8 41.6 41.5  PLT 263 246 278  CREATININE 0.66 0.70  --    Estimated Creatinine Clearance: 288.6 ml/min (by C-G formula based on Cr of 0.7).  Medical History: Past Medical History  Diagnosis Date  . Sleep apnea   . Pneumonia   . Obesity   . Vaginal bleeding    Assessment: 32 yo female with c/o shortness of breath and swelling in the lower extremities.  She has an elevated D-Dimer and is being evaluated for possible DVT/PE.  We have been asked to initiate SQ LMWH for her until work up is complete.  She has been on Aygestin which has the potential to increase her risk of thrombosis.  This has now been discontinued.  She is morbidly obese with an estimated weight of 362 kg.  She has some ongoing vaginal bleeding and we will need to watch this closely.  Typical dosing does not require a cap for weight, however, we will initiate therapy with 300 mg LMWH every 12 hours and draw a 4 hour anti-Xa level to see if she falls within desired goal range.  Her CBC is stable as well as her platelets.  Her renal function is also stable.  LMWH level done 5 hrs post dose 0.59 would have been in goal range 0.6-1.2 had it been drawn on time 4 hrs post dose.   Goal of Therapy:  Anti-Xa level 0.6-1.2 units/ml 4hrs after LMWH dose given Monitor platelets by anticoagulation protocol: Yes   Plan:  Lovenox 300mg  SQ q12 hours to  continue Would recommend rechecking at steady state after 3-5 doses also.   Shaguana Love S. Merilynn Finland, PharmD, [facility] Clinical Staff Pharmacist Pager [phone]  12/08/2012,9:05 PM

## 2012-12-09 DIAGNOSIS — R609 Edema, unspecified: Secondary | ICD-10-CM

## 2012-12-09 DIAGNOSIS — J45909 Unspecified asthma, uncomplicated: Secondary | ICD-10-CM

## 2012-12-09 DIAGNOSIS — D72829 Elevated white blood cell count, unspecified: Secondary | ICD-10-CM

## 2012-12-09 DIAGNOSIS — I517 Cardiomegaly: Secondary | ICD-10-CM

## 2012-12-09 DIAGNOSIS — R791 Abnormal coagulation profile: Secondary | ICD-10-CM

## 2012-12-09 DIAGNOSIS — J189 Pneumonia, unspecified organism: Principal | ICD-10-CM

## 2012-12-09 LAB — BASIC METABOLIC PANEL
CO2: 35 mEq/L — ABNORMAL HIGH (ref 19–32)
Chloride: 97 mEq/L (ref 96–112)
GFR calc Af Amer: >90 mL/min (ref >90)
Sodium: 138 mEq/L (ref 135–145)

## 2012-12-09 MED ORDER — WARFARIN SODIUM 10 MG PO TABS
10.0000 mg | ORAL_TABLET | Freq: Once | ORAL | Status: AC
  Administered 2012-12-09: 18:00:00 10 mg via ORAL
  Filled 2012-12-09: qty 1

## 2012-12-09 MED ORDER — COUMADIN BOOK
Freq: Once | Status: AC
  Administered 2012-12-09: 08:00:00
  Filled 2012-12-09: qty 1

## 2012-12-09 MED ORDER — WARFARIN - PHARMACIST DOSING INPATIENT
Freq: Every day | Status: DC
  Administered 2012-12-09: 18:00:00

## 2012-12-09 MED ORDER — WARFARIN VIDEO
Freq: Once | Status: AC
  Administered 2012-12-09: 08:00:00

## 2012-12-09 NOTE — Evaluation (Signed)
Physical Therapy Evaluation Patient Details Name: Isabel Roberts MRN: [id number] DOB: 11/01/1980 Today's Date: 12/09/2012 Time: 4098-1191 PT Time Calculation (min): 31 min  PT Assessment / Plan / Recommendation History of Present Illness  pt presents with CAP and morbid obesity.    Clinical Impression  Pt presents with CAP and morbid obesity.  At this time body habitus is biggest factor limiting mobility.  Spoke with pt about RW in room is rated for only up to 650lbs.  Pt states she will keep using it until her boyfriend can bring in her RW from home.  Called portable equipment about getting pt a chair.  Currently all bariatric recliners are in use, but they are only rated up to 750lbs.  Pt will need a chair that is rated 800lbs or greater.  Will continue to follow.      PT Assessment  Patient needs continued PT services    Follow Up Recommendations  Home health PT;Supervision - Intermittent    Does the patient have the potential to tolerate intense rehabilitation      Barriers to Discharge        Equipment Recommendations  3in1 (PT)    Recommendations for Other Services OT consult   Frequency Min 3X/week    Precautions / Restrictions Precautions Precautions: Fall Restrictions Weight Bearing Restrictions: No   Pertinent Vitals/Pain Indicates Bil LEs sore form edema.        Mobility  Bed Mobility Bed Mobility: Supine to Sit;Sitting - Scoot to Edge of Bed Supine to Sit: 5: Supervision;With rails;HOB elevated Sitting - Scoot to Edge of Bed: 5: Supervision Details for Bed Mobility Assistance: pt uses momentum of body habitus to A with scoot to EOB and bringing trunk up to sit.   Transfers Transfers: Sit to Stand;Stand to Sit Sit to Stand: 5: Supervision;With upper extremity assist;From bed Stand to Sit: 5: Supervision;To chair/3-in-1 Details for Transfer Assistance: pt again uses body habitus and rocks for momentum to A with bringing pt up to standing position.    Ambulation/Gait Ambulation/Gait Assistance: 5: Supervision Ambulation Distance (Feet): 3 Feet Assistive device: Rolling walker Ambulation/Gait Assistance Details: pt with very wide BOS and leans heavily on RW.  Discueed with pt weight limit of RW is 650lbs, however pt still wanting to use it to get to commode.   Gait Pattern: Step-through pattern;Decreased stride length;Trunk flexed;Wide base of support Stairs: No Wheelchair Mobility Wheelchair Mobility: No    Exercises     PT Diagnosis: Difficulty walking  PT Problem List: Decreased activity tolerance;Decreased balance;Decreased mobility;Decreased knowledge of use of DME;Obesity PT Treatment Interventions: DME instruction;Gait training;Stair training;Functional mobility training;Therapeutic activities;Therapeutic exercise;Balance training;Patient/family education     PT Goals(Current goals can be found in the care plan section) Acute Rehab PT Goals Patient Stated Goal: Home PT Goal Formulation: With patient Time For Goal Achievement: 12/23/12 Potential to Achieve Goals: Good  Visit Information  Last PT Received On: 12/09/12 Assistance Needed: +2 (safety) History of Present Illness: pt presents with CAP and morbid obesity.         Prior Functioning  Home Living Family/patient expects to be discharged to:: Private residence Living Arrangements: Spouse/significant other Available Help at Discharge: Family;Available PRN/intermittently Type of Home: House Home Access: Level entry (pt indicates there is a small "edge" to step up to doorway) Home Layout: One level Home Equipment: Walker - 2 wheels;Shower seat Prior Function Level of Independence: Needs assistance Gait / Transfers Assistance Needed: Uses RW for short distance and has electric scooter on order.  ADL's / Homemaking Assistance Needed: pt can cook and has boyfriend A with cleaning.   Communication Communication: No difficulties    Cognition   Cognition Arousal/Alertness: Awake/alert Behavior During Therapy: WFL for tasks assessed/performed Overall Cognitive Status: Within Functional Limits for tasks assessed    Extremity/Trunk Assessment Upper Extremity Assessment Upper Extremity Assessment: Overall WFL for tasks assessed Lower Extremity Assessment Lower Extremity Assessment: Overall WFL for tasks assessed   Balance Balance Balance Assessed: Yes Static Standing Balance Static Standing - Balance Support: Bilateral upper extremity supported Static Standing - Level of Assistance: 5: Stand by assistance  End of Session PT - End of Session Equipment Utilized During Treatment: Oxygen Activity Tolerance: Patient limited by fatigue Patient left:  (Sitting on commode with Nsg tech for bath.  ) Nurse Communication: Mobility status  GP     Sunny Schlein, PT [phone] 12/09/2012, 1:30 PM

## 2012-12-09 NOTE — Progress Notes (Signed)
Echocardiogram 2D Echocardiogram has been performed.  [place], [place] 12/09/2012, 2:15 PM

## 2012-12-09 NOTE — Progress Notes (Signed)
ANTICOAGULATION CONSULT NOTE - Follow Up  Pharmacy Consult for Lovenox/Warfarin Indication:  Elevated D-Dimer - Possible DVT/PE  Allergies  Allergen Reactions  . Fish Allergy Hives and Other (See Comments)    Throat swells  . Other Other (See Comments)    Grass causes a rash  . Shrimp [Shellfish Allergy] Rash   Patient Measurements: Weight - 362.4 kg  Vital Signs: Temp: 98.3 F (36.8 C) (09/16 0654) Temp src: Oral (09/16 0654) BP: 143/89 mmHg (09/16 0654) Pulse Rate: 86 (09/16 0654)  Labs:  Recent Labs  12/08/12 0005 12/08/12 0740 12/08/12 1643  HGB 12.0 12.5 12.7  HCT 40.8 41.6 41.5  PLT 263 246 278  CREATININE 0.66 0.70  --    Estimated Creatinine Clearance: 288.6 ml/min (by C-G formula based on Cr of 0.7).  Medical History: Past Medical History  Diagnosis Date  . Sleep apnea   . Pneumonia   . Obesity   . Vaginal bleeding    Assessment: 32 yo female with c/o shortness of breath and swelling in the lower extremities.  She has an elevated D-Dimer and is being evaluated for possible DVT/PE.  We have been asked to initiate SQ LMWH for her until work up is complete.  She has been on Aygestin which has the potential to increase her risk of thrombosis.  This has now been discontinued.  She is morbidly obese with an estimated weight of 362 kg. I spoke with her this morning and she confirms that she has some ongoing vaginal bleeding and we will need to watch this closely and she will let us know if this worsens.  Doppler attempts made last evening and were inconclusive.  [facility] transfer has been ruled out and we will now add Warfarin to her regimen.  Drug-Drug Interaction Macrolide and Ketolides / Anticoagulants - Hypoprothrombinemic effects of warfarin may be increased by azithromycin.   Education Will provide teaching booklet and video for review today.  Spoke with her a little but will provide more in depth teaching prior to discharge.  Goal of Therapy:  INR:  2.0-3.0 Anti-Xa level 0.6-1.2 units/ml 4hrs after LMWH dose given Monitor platelets by anticoagulation protocol: Yes   Plan:  Warfarin 10 mg PO x 1 today Daily PT/INR Lovenox 300mg  SQ q12 hours Obtain a 4 hour Anti-Xa level and adjust dose if she is not within desired goal range.  Nadara Mustard, PharmD., MS Clinical Pharmacist Pager:  [phone] Thank you for allowing pharmacy to be part of this patients care team. 12/09/2012,8:05 AM

## 2012-12-09 NOTE — Progress Notes (Signed)
Pt a/o, had c/o HA this am PRN tylenol given with good effect, pt oob to [facility] with PT tolerated well, pt stated her boyfriend will bring in her walker from home tonight and PT will ambulate pt tomorrow, pt on abx, still c/o DOE, will continue to monitor

## 2012-12-09 NOTE — Progress Notes (Signed)
[facility] PROGRESS NOTE  Isabel Roberts ZOX:[id number] DOB: 10-28-1980 DOA: 12/07/2012 PCP: [name], MD  Assessment/Plan  Acute hypoxic respiratory failure. Differential diagnosis includes community acquired pneumonia, pulmonary edema, pulmonary embolism.  She has had lower extremity swelling suggesting right vs. Left heart failure or DVT.  Pro-BNP often falsely low in setting of obesity. D-dimer moderately elevated.  Unable to complete confirmatory testing due to body habitus.  Spoke with [facility], [place], and [place] [employer] who either do not have equipment to perform CT or VQ for weight > 550lbs or no longer have the protocol/staff to conduct CTa on humans.   CAP -  S. pneumo and legionella neg -  Continue azithromycin 500mg  daily x 3 days -  Continue ceftriaxone  Acute asthma exacerbation -  Continue prednisone taper -  Continue duonebs q4h and add albuterol neb q2h prn  Elevated D-dimer -  Start empiric A/C for minimum of 3 months -  Lovenox teaching -  Coumadin teaching -  INR to be managed by Dr. Redmond School, her PCP.   -  Home health RN to draw INR/CBC and fax to Dr. Pilar Grammes office, assist with lovenox injections -  D/c birth control -  Monitor for vaginal bleeding -  Will need GYN follow up  OSA/OHS -  Continue nightly bipap  Lower extremity swelling.  DDx includes diastolic heart failure, right ventricular failure secondary to asthma, OHS/OSA, dvt -  ECHO with poor windows. -  Lower extremity duplex nondiagnostic -  Strict I/O:  -3.2L (neg 7.4L so far) -  Lasix 40mg  IV BID  Hyperkalemia, -  Discontinue standing potassium   Morbid obesity -  Nutrition consultation -  Once bedside commode established, please d/c foley -  PT/OT recommending home health with 3-in-1  Hx of vaginal bleeding -  Monitor clinically -  CBC monitoring   Leukocytosis, rising likely secondary to steroids -  Trend  Diet:  Dysphagia 3 with thin liquids Access:   PIV IVF:  OFF Proph:  Therapeutic lovenox  Code Status: full Family Communication: patient alone Disposition Plan: pending improvement in dyspnea   Consultants:  Pharmacy  Procedures:  CXR  Duplex lower extremity  Antibiotics:  Ceftriaxone 9/15  Azithromycin 9/15   HPI/Subjective:  Patient states that her breathing has become easier, but still SOB at rest.   Objective: Filed Vitals:   12/08/12 2321 12/09/12 0654 12/09/12 0851 12/09/12 1510  BP:  143/89  153/72  Pulse: 98 86  90  Temp:  98.3 F (36.8 C)  98.4 F (36.9 C)  TempSrc:  Oral  Oral  Resp: 20 18  18   Height:      Weight:      SpO2: 96% 99% 93% 97%    Intake/Output Summary (Last 24 hours) at 12/09/12 1853 Last data filed at 12/09/12 1742  Gross per 24 hour  Intake    780 ml  Output   5450 ml  Net  -4670 ml   Filed Weights   12/07/12 2212  Weight: 362.424 kg (799 lb)    Exam:   General:  Morbidly obese African American female, No acute distress, nasal cannula in place  HEENT:  NCAT, MMM  Cardiovascular:  RRR, nl S1, S2 no mrg, 2+ pulses, warm extremities  Respiratory:  Expiratory wheeze, diminished, and no audible focal rales or rhonchi, no increased WOB  Abdomen:   NABS, soft, NT/ND  MSK:   Normal tone and bulk, 1+ bilateral LEE  Neuro:  Grossly  intact  Data Reviewed: Basic Metabolic Panel:  Recent Labs Lab 12/08/12 0005 12/08/12 0740 12/09/12 0740  NA 138 140 138  K 4.1 4.2 5.8*  CL 100 101 97  CO2 32 34* 35*  GLUCOSE 86 109* 166*  BUN 11 11 11   CREATININE 0.66 0.70 0.58  CALCIUM 8.9 9.1 9.4   Liver Function Tests:  Recent Labs Lab 12/08/12 0740  AST 17  ALT 19  ALKPHOS 148*  BILITOT 0.6  PROT 7.3  ALBUMIN 3.5   No results found for this basename: LIPASE, AMYLASE,  in the last 168 hours No results found for this basename: AMMONIA,  in the last 168 hours CBC:  Recent Labs Lab 12/08/12 0005 12/08/12 0740 12/08/12 1643  WBC 10.7* 13.1* 15.3*   NEUTROABS 7.9* 10.7*  --   HGB 12.0 12.5 12.7  HCT 40.8 41.6 41.5  MCV 91.3 91.2 90.2  PLT 263 246 278   Cardiac Enzymes: No results found for this basename: CKTOTAL, CKMB, CKMBINDEX, TROPONINI,  in the last 168 hours BNP (last 3 results)  Recent Labs  12/08/12 0005  PROBNP 193.8*   CBG: No results found for this basename: GLUCAP,  in the last 168 hours  No results found for this or any previous visit (from the past 240 hour(s)).   Studies: Dg Chest Port 1 View  12/07/2012   CLINICAL DATA:  Shortness of breath.  EXAM: PORTABLE CHEST - 1 VIEW  COMPARISON:  No priors.  FINDINGS: Film is underpenetrated, limiting the diagnostic sensitivity and specificity the examination. With these limitations in mind, there do appear to be patchy interstitial and multifocal airspace opacities throughout the lungs, most confluent in the right mid to lower lung and at the left base, concerning for potential multilobar pneumonia. Sequela of aspiration could have a similar appearance. No definite scratch at no definite pleural effusions. No evidence of pulmonary edema. Heart size appears mildly enlarged. The patient is rotated to the left on today's exam, resulting in distortion of the mediastinal contours and reduced diagnostic sensitivity and specificity for mediastinal pathology.  IMPRESSION: 1. Multifocal patchy interstitial and airspace disease in the lung lungs, as above, concerning for multilobar pneumonia or sequela of aspiration. 2. Mild cardiomegaly.   Electronically Signed   By: Trudie Reed M.D.   On: 12/07/2012 22:48    Scheduled Meds: . ipratropium  0.5 mg Nebulization Q4H   And  . albuterol  2.5 mg Nebulization Q4H  . azithromycin  500 mg Oral Daily  . cefTRIAXone (ROCEPHIN)  IV  1 g Intravenous Q24H  . enoxaparin (LOVENOX) injection  300 mg Subcutaneous Q12H  . ferrous sulfate  325 mg Oral TID WC  . furosemide  40 mg Intravenous BID  . methylPREDNISolone (SOLU-MEDROL) injection  60  mg Intravenous Q12H  . Warfarin - Pharmacist Dosing Inpatient   Does not apply q1800   Continuous Infusions:   Principal Problem:   CAP (community acquired pneumonia) Active Problems:   Morbid obesity   OSA (obstructive sleep apnea)   Asthma   Elevated d-dimer   Bilateral lower extremity edema   Leukocytosis, unspecified    Time spent: 30 min    Matthieu Loftus, [facility]  [facility] Pager [phone]. If 7PM-7AM, please contact night-coverage at [email or web address], password [facility] 12/09/2012, 6:53 PM  LOS: 2 days

## 2012-12-09 NOTE — Progress Notes (Signed)
Utilization Review Completed.   Chablis Losh, RN, BSN Nurse Case Manager  [phone]  

## 2012-12-09 NOTE — Progress Notes (Signed)
  RD consulted for nutrition education regarding weight loss.  Body mass index is 137.08 kg/(m^2). Pt meets criteria for Morbid Obesity based on current BMI.  RD provided "Weight Loss Tips" handout from the Academy of [employer]etics. Emphasized the importance of serving sizes and provided examples of correct portions of common foods. Discussed importance of controlled and consistent intake throughout the day. Provided examples of ways to balance meals/snacks and encouraged intake of high-fiber, whole grain complex carbohydrates. Emphasized the importance of hydration with calorie-free beverages and limiting sugar-sweetened beverages. Encouraged pt to discuss physical activity options with physician. Pt reports eating an entire bag of potato chips throughout the day; discussed healthier snack alternatives. Pt states she will try to drink more water, eat more fruits and vegetables, cut back on fried food, and eat healthier snacks. Teach back method used.  Expect good compliance.  Current diet order is Dysphagia 3, patient is consuming approximately 100% of meals at this time. Labs and medications reviewed. No further nutrition interventions warranted at this time. RD contact information provided. If additional nutrition issues arise, please re-consult RD.  Ian Malkin RD, LDN Inpatient Clinical Dietitian Pager: [phone] After Hours Pager: [phone]

## 2012-12-10 LAB — BASIC METABOLIC PANEL
CO2: 33 mEq/L — ABNORMAL HIGH (ref 19–32)
Calcium: 9.2 mg/dL (ref 8.4–10.5)
GFR calc non Af Amer: >90 mL/min (ref >90)
Glucose, Bld: 138 mg/dL — ABNORMAL HIGH (ref 70–99)
Potassium: 4.2 mEq/L (ref 3.5–5.1)
Sodium: 138 mEq/L (ref 135–145)

## 2012-12-10 LAB — CBC
HCT: 40.3 % (ref 36.0–46.0)
Hemoglobin: 12.8 g/dL (ref 12.0–15.0)
MCHC: 31.8 g/dL (ref 30.0–36.0)
RDW: 14.5 % (ref 11.5–15.5)
WBC: 15 10*3/uL — ABNORMAL HIGH (ref 4.0–10.5)

## 2012-12-10 LAB — PROTIME-INR
INR: 1.15 (ref 0.00–1.49)
Prothrombin Time: 14.5 seconds (ref 11.6–15.2)

## 2012-12-10 MED ORDER — PREDNISONE 50 MG PO TABS
50.0000 mg | ORAL_TABLET | Freq: Every day | ORAL | Status: DC
  Administered 2012-12-11: 50 mg via ORAL
  Filled 2012-12-10 (×2): qty 1

## 2012-12-10 MED ORDER — INFLUENZA VAC SPLIT QUAD 0.5 ML IM SUSP
0.5000 mL | INTRAMUSCULAR | Status: AC
  Filled 2012-12-10: qty 0.5

## 2012-12-10 MED ORDER — PNEUMOCOCCAL VAC POLYVALENT 25 MCG/0.5ML IJ INJ
0.5000 mL | INJECTION | INTRAMUSCULAR | Status: AC
  Administered 2012-12-11: 0.5 mL via INTRAMUSCULAR
  Filled 2012-12-10: qty 0.5

## 2012-12-10 MED ORDER — LEVOFLOXACIN 750 MG PO TABS
750.0000 mg | ORAL_TABLET | Freq: Every day | ORAL | Status: DC
  Administered 2012-12-10 – 2012-12-12 (×3): 750 mg via ORAL
  Filled 2012-12-10 (×3): qty 1

## 2012-12-10 NOTE — Plan of Care (Signed)
Problem: Phase II Progression Outcomes Goal: Discharge plan established Outcome: Completed/Met Date Met:  12/10/12 Discharge to home  Problem: Phase III Progression Outcomes Goal: O2 sats > or equal to 93% on room air Outcome: Not Applicable Date Met:  12/10/12 Wears 02 prn and will need @ home Goal: Pain controlled on oral analgesia Outcome: Not Applicable Date Met:  12/10/12 Denies pain Goal: Activity at appropriate level-compared to baseline (UP IN CHAIR FOR HEMODIALYSIS)  Outcome: Completed/Met Date Met:  12/10/12 Up with PT and ambulated to door - Up with assist to [facility].

## 2012-12-10 NOTE — Progress Notes (Signed)
SATURATION QUALIFICATIONS: (This note is used to comply with regulatory documentation for home oxygen)  Patient Saturations on Room Air at Rest = 82%  Patient Saturations on Room Air while Ambulating = 66%  Patient Saturations on 3 Liters of oxygen while Ambulating = 73%  Please briefly explain why patient needs home oxygen: Without supplemental O2 pt desaturates to 66% with only ambulating 5'.    [address] [place], [place] [phone]

## 2012-12-10 NOTE — Progress Notes (Signed)
Pt. Stated that she would place herself on CPAP before going to bed. Pt. Was made aware to call RT if she needed assistance with CPAP.

## 2012-12-10 NOTE — Progress Notes (Signed)
12/09/12 Pt.is A/Ox4 and is two person assist with walker to the bariatic bedside commode. She had no c/o pain or signs of distress. She wears the cpap at night with 3L [place] oxygen.

## 2012-12-10 NOTE — Progress Notes (Signed)
ANTICOAGULATION CONSULT NOTE - Follow Up  Pharmacy Consult for Lovenox/Warfarin Indication:  Elevated D-Dimer - Possible DVT/PE  Allergies  Allergen Reactions  . Fish Allergy Hives and Other (See Comments)    Throat swells  . Other Other (See Comments)    Grass causes a rash  . Shrimp [Shellfish Allergy] Rash   Patient Measurements: Weight - 362.4 kg  Vital Signs: Temp: 98.2 F (36.8 C) (09/17 0850) Temp src: Oral (09/17 0850) BP: 139/69 mmHg (09/17 0850) Pulse Rate: 87 (09/17 0850)  Labs:  Recent Labs  12/08/12 0005 12/08/12 0740 12/08/12 1643 12/09/12 0740 12/10/12 0725  HGB 12.0 12.5 12.7  --   --   HCT 40.8 41.6 41.5  --   --   PLT 263 246 278  --   --   LABPROT  --   --   --   --  14.5  INR  --   --   --   --  1.15  CREATININE 0.66 0.70  --  0.58 0.66   Estimated Creatinine Clearance: 288.6 ml/min (by C-G formula based on Cr of 0.66).  Medical History: Past Medical History  Diagnosis Date  . Sleep apnea   . Pneumonia   . Obesity   . Vaginal bleeding    Assessment: 32 yo female with c/o shortness of breath and swelling in the lower extremities.  She has an elevated D-Dimer and is being evaluated for possible DVT/PE.  She has received initial dosing of SQ LMWH without noted complications.  I spoke with her this morning and she indicates that she only had spot bleeding yesterday, none thus far today.  She was started on oral Warfarin therapy yesterday and her INR today is 1.15 after 1 x 10mg  dose.  She is morbidly obese with an estimated weight of 362 kg.   Drug-Drug Interaction Macrolide and Ketolides / Anticoagulants - Hypoprothrombinemic effects of warfarin may be increased by azithromycin.   Education Teaching booklet and video provided for her review yesterday.  I discussed her anticoagulation in depth with her today and she understands it's importance.  She asked appropriate questions and seemed to have a good comprehension.   Goal of Therapy:  INR:  2.0-3.0 Anti-Xa level 0.6-1.2 units/ml 4hrs after LMWH dose given Monitor platelets by anticoagulation protocol: Yes   Plan:  Warfarin 10 mg PO x 1 today Daily PT/INR Lovenox 300mg  SQ q12 hours Obtain a 4 hour Anti-Xa level and adjust dose if she is not within desired goal range.  Nadara Mustard, PharmD., MS Clinical Pharmacist Pager:  [phone] Thank you for allowing pharmacy to be part of this patients care team. 12/10/2012,10:10 AM

## 2012-12-10 NOTE — Progress Notes (Signed)
[facility] PROGRESS NOTE  Assessment/Plan: CAP: - S. pneumo and legionella neg  - Rocephin and azithro de-escalate to levaquin. - worsening WBC is due to steroids.  Acute asthma exacerbation  - Continue prednisone taper. - Continue duonebs q4h.  Elevated D-dimer: - Due to PNA, she had SOB (she is an asthmatic), not tachycardic. - Unlikely PE, swelling of the legs resolved with lasix. Elevated d-dimer can be explained by the PNA. - D/c anticoagulation. - She low probability for PE. - I have d/w with patient she understand the risk and benefit and will like to proceed with d/c anticoagulation.  OSA/OHS: - Continue nightly bipap  - ECHO with poor windows.   - Lower extremity duplex nondiagnostic  - Strict I/O: -3.2L (neg 7.4L so far)  - Lasix 40mg  IV BID   Hyperkalemia: - Discontinue standing potassium   Morbid obesity: - Nutrition consultation  - Once bedside commode established, please d/c foley  - PT/OT recommending home health with 3-in-1  Code Status: full  Family Communication: patient alone  Disposition Plan: pending improvement in dyspnea    Consultants:  none  Procedures:  ECHO  Lower ext doppler  Antibiotics:  levaquin  HPI/Subjective: She relates her SOB is improved  Objective: Filed Vitals:   12/10/12 0631 12/10/12 0845 12/10/12 0850 12/10/12 0922  BP: 142/85  139/69   Pulse: 89  87   Temp: 98 F (36.7 C)  98.2 F (36.8 C)   TempSrc: Oral  Oral   Resp: 19  20   Height:      Weight:  346.2 kg (763 lb 3.7 oz)    SpO2: 90%  95% 95%    Intake/Output Summary (Last 24 hours) at 12/10/12 1302 Last data filed at 12/10/12 1138  Gross per 24 hour  Intake   2395 ml  Output   2851 ml  Net   -456 ml   Filed Weights   12/07/12 2212 12/10/12 0845  Weight: 362.424 kg (799 lb) 346.2 kg (763 lb 3.7 oz)    Exam:  General: Alert, awake, oriented x3, in no acute distress.  HEENT: No bruits, no goiter.  Heart: Regular rate and rhythm,  without murmurs, rubs, gallops.  Lungs: Good air movement, bilateral air movement.    Data Reviewed: Basic Metabolic Panel:  Recent Labs Lab 12/08/12 0005 12/08/12 0740 12/09/12 0740 12/10/12 0725  NA 138 140 138 138  K 4.1 4.2 5.8* 4.2  CL 100 101 97 97  CO2 32 34* 35* 33*  GLUCOSE 86 109* 166* 138*  BUN 11 11 11 13   CREATININE 0.66 0.70 0.58 0.66  CALCIUM 8.9 9.1 9.4 9.2   Liver Function Tests:  Recent Labs Lab 12/08/12 0740  AST 17  ALT 19  ALKPHOS 148*  BILITOT 0.6  PROT 7.3  ALBUMIN 3.5   No results found for this basename: LIPASE, AMYLASE,  in the last 168 hours No results found for this basename: AMMONIA,  in the last 168 hours CBC:  Recent Labs Lab 12/08/12 0005 12/08/12 0740 12/08/12 1643  WBC 10.7* 13.1* 15.3*  NEUTROABS 7.9* 10.7*  --   HGB 12.0 12.5 12.7  HCT 40.8 41.6 41.5  MCV 91.3 91.2 90.2  PLT 263 246 278   Cardiac Enzymes: No results found for this basename: CKTOTAL, CKMB, CKMBINDEX, TROPONINI,  in the last 168 hours BNP (last 3 results)  Recent Labs  12/08/12 0005  PROBNP 193.8*   CBG: No results found for this basename: GLUCAP,  in the  last 168 hours  No results found for this or any previous visit (from the past 240 hour(s)).   Studies: No results found.  Scheduled Meds: . ipratropium  0.5 mg Nebulization Q4H   And  . albuterol  2.5 mg Nebulization Q4H  . cefTRIAXone (ROCEPHIN)  IV  1 g Intravenous Q24H  . enoxaparin (LOVENOX) injection  300 mg Subcutaneous Q12H  . ferrous sulfate  325 mg Oral TID WC  . furosemide  40 mg Intravenous BID  . methylPREDNISolone (SOLU-MEDROL) injection  60 mg Intravenous Q12H  . Warfarin - Pharmacist Dosing Inpatient   Does not apply q1800   Continuous Infusions:    Marinda Elk  [facility] Pager [phone]. If 8PM-8AM, please contact night-coverage at [email or web address], password [facility] 12/10/2012, 1:02 PM  LOS: 3 days

## 2012-12-10 NOTE — Progress Notes (Signed)
Physical Therapy Treatment Patient Details Name: Isabel Roberts MRN: [id number] DOB: 02-03-1981 Today's Date: 12/10/2012 Time: 4098-1191 PT Time Calculation (min): 41 min  PT Assessment / Plan / Recommendation  History of Present Illness pt presents with CAP and morbid obesity.     PT Comments   Homero Fellers discussion with pt about weight capacity of RW being only 650lbs and pt notes her boyfriend did not bring in her RW from home as was expected.  Pt still wanting to use RW despite PT stating that RW is not rated to 799lbs.  With only amb short distances, pt desats to 72-73% on 3L O2.  Spoke with RN and CM about HHPT and O2 needs for home.  Will continue to follow.    Follow Up Recommendations  Home health PT;Supervision - Intermittent     Does the patient have the potential to tolerate intense rehabilitation     Barriers to Discharge        Equipment Recommendations  3in1 (PT)    Recommendations for Other Services OT consult  Frequency Min 3X/week   Progress towards PT Goals Progress towards PT goals: Progressing toward goals  Plan Current plan remains appropriate    Precautions / Restrictions Precautions Precautions: Fall Restrictions Weight Bearing Restrictions: No   Pertinent Vitals/Pain Denies pain.      Mobility  Bed Mobility Bed Mobility: Supine to Sit;Sitting - Scoot to Edge of Bed;Sit to Supine Supine to Sit: 5: Supervision;With rails;HOB elevated Sitting - Scoot to Edge of Bed: 5: Supervision Sit to Supine: 1: +2 Total assist Sit to Supine: Patient Percentage: 80% Details for Bed Mobility Assistance: pt uses momentum of body habitus to A with scoot to EOB and bringing trunk up to sit.  with return to bed pt almost rolls into bed and requires one person to help lift her R LE into bed and another person to put up bed rail to keep R LE in bed.   Transfers Transfers: Sit to Stand;Stand to Sit Sit to Stand: 5: Supervision;With upper extremity assist;From bed Stand to Sit:  5: Supervision;To chair/3-in-1 Details for Transfer Assistance: pt again uses body habitus and rocks for momentum to A with bringing pt up to standing position.   Ambulation/Gait Ambulation/Gait Assistance: 5: Supervision Ambulation Distance (Feet): 20 Feet Assistive device: Rolling walker Ambulation/Gait Assistance Details: Again discussed with pt that current RW only rated to 650lbs.  pt still wanting to use it to amb to door and back.  pt with very wide BOS and holds breath at times vs very labored breathing.   Gait Pattern: Step-through pattern;Decreased stride length;Trunk flexed;Wide base of support Stairs: No Wheelchair Mobility Wheelchair Mobility: No    Exercises     PT Diagnosis:    PT Problem List:   PT Treatment Interventions:     PT Goals (current goals can now be found in the care plan section) Acute Rehab PT Goals Patient Stated Goal: Home Time For Goal Achievement: 12/23/12 Potential to Achieve Goals: Good  Visit Information  Last PT Received On: 12/10/12 Assistance Needed: +2 (safety) History of Present Illness: pt presents with CAP and morbid obesity.      Subjective Data  Patient Stated Goal: Home   Cognition  Cognition Arousal/Alertness: Awake/alert Behavior During Therapy: WFL for tasks assessed/performed Overall Cognitive Status: Within Functional Limits for tasks assessed    Balance  Balance Balance Assessed: Yes Static Standing Balance Static Standing - Balance Support: Bilateral upper extremity supported;During functional activity Static Standing - Level of  Assistance: 5: Stand by assistance Static Standing - Comment/# of Minutes: pt able to use RW for support to bend forward and allow PT to perform peri hygiene.    End of Session PT - End of Session Equipment Utilized During Treatment: Oxygen Activity Tolerance: Patient limited by fatigue Patient left: in bed;with call bell/phone within reach Nurse Communication: Mobility status   GP      Isabel Roberts, Pink [phone] 12/10/2012, 12:13 PM

## 2012-12-11 LAB — CLOSTRIDIUM DIFFICILE BY PCR: Toxigenic C. Difficile by PCR: NEGATIVE

## 2012-12-11 LAB — BASIC METABOLIC PANEL
BUN: 17 mg/dL (ref 6–23)
Chloride: 95 mEq/L — ABNORMAL LOW (ref 96–112)
GFR calc Af Amer: >90 mL/min (ref >90)
GFR calc non Af Amer: >90 mL/min (ref >90)
Potassium: 3.6 mEq/L (ref 3.5–5.1)
Sodium: 137 mEq/L (ref 135–145)

## 2012-12-11 LAB — PROTIME-INR: INR: 1.12 (ref 0.00–1.49)

## 2012-12-11 MED ORDER — FUROSEMIDE 40 MG PO TABS
40.0000 mg | ORAL_TABLET | Freq: Every day | ORAL | Status: DC
  Administered 2012-12-12: 40 mg via ORAL
  Filled 2012-12-11 (×2): qty 1

## 2012-12-11 MED ORDER — ENOXAPARIN SODIUM 150 MG/ML ~~LOC~~ SOLN
150.0000 mg | SUBCUTANEOUS | Status: DC
  Administered 2012-12-11: 18:00:00 150 mg via SUBCUTANEOUS
  Filled 2012-12-11 (×2): qty 1

## 2012-12-11 MED ORDER — PREDNISONE 20 MG PO TABS
40.0000 mg | ORAL_TABLET | Freq: Every day | ORAL | Status: DC
  Administered 2012-12-12: 08:00:00 40 mg via ORAL
  Filled 2012-12-11 (×2): qty 2

## 2012-12-11 NOTE — Progress Notes (Signed)
[facility] PROGRESS NOTE  Assessment/Plan: CAP: - S. pneumo and legionella neg  - Rocephin and azithro de-escalate to levaquin. - worsening WBC is due to steroids. - SOB improved. - now with diarrhea check a c.diff.  Acute asthma exacerbation  - Continue prednisone taper. - Continue duonebs q4h.  Elevated D-dimer: - Due to PNA, she had SOB (she is an asthmatic), not tachycardic. - Unlikely PE, swelling of the legs resolved with lasix. Elevated d-dimer can be explained by the PNA. - D/c anticoagulation. - I have d/w with patient she understand the risk and benefit and will like to proceed with d/c anticoagulation.  OSA/OHS: - Continue nightly bipap  - ECHO with poor windows.   - Lower extremity duplex nondiagnostic  - Strict I/O: -3.2L (neg 7.4L so far)  - change lasix to 40 mg daily.  Hyperkalemia: - Resolved.  Morbid obesity: - Nutrition consultation  - Once bedside commode established, please d/c foley  - PT/OT recommending home health with 3-in-1.  Code Status: full  Family Communication: patient alone  Disposition Plan: pending improvement in dyspnea    Consultants:  none  Procedures:  ECHO  Lower ext doppler  Antibiotics:  levaquin  HPI/Subjective: SOB resolved, Diarrhea since yesterday.  Objective: Filed Vitals:   12/10/12 2140 12/10/12 2330 12/11/12 0536 12/11/12 1128  BP: 131/62  127/52   Pulse: 88 90 97   Temp: 98.2 F (36.8 C)  98 F (36.7 C)   TempSrc: Oral  Oral   Resp: 18 18 18    Height:      Weight:   346.8 kg (764 lb 8.9 oz)   SpO2: 100% 100% 95% 93%    Intake/Output Summary (Last 24 hours) at 12/11/12 1250 Last data filed at 12/11/12 1158  Gross per 24 hour  Intake    900 ml  Output   1403 ml  Net   -503 ml   Filed Weights   12/07/12 2212 12/10/12 0845 12/11/12 0536  Weight: 362.424 kg (799 lb) 346.2 kg (763 lb 3.7 oz) 346.8 kg (764 lb 8.9 oz)    Exam:  General: Alert, awake, oriented x3, in no acute  distress.  HEENT: No bruits, no goiter.  Heart: Regular rate and rhythm, without murmurs, rubs, gallops.  Lungs: Good air movement, bilateral air movement.    Data Reviewed: Basic Metabolic Panel:  Recent Labs Lab 12/08/12 0005 12/08/12 0740 12/09/12 0740 12/10/12 0725 12/11/12 0500  NA 138 140 138 138 137  K 4.1 4.2 5.8* 4.2 3.6  CL 100 101 97 97 95*  CO2 32 34* 35* 33* 36*  GLUCOSE 86 109* 166* 138* 101*  BUN 11 11 11 13 17   CREATININE 0.66 0.70 0.58 0.66 0.75  CALCIUM 8.9 9.1 9.4 9.2 9.0   Liver Function Tests:  Recent Labs Lab 12/08/12 0740  AST 17  ALT 19  ALKPHOS 148*  BILITOT 0.6  PROT 7.3  ALBUMIN 3.5   No results found for this basename: LIPASE, AMYLASE,  in the last 168 hours No results found for this basename: AMMONIA,  in the last 168 hours CBC:  Recent Labs Lab 12/08/12 0005 12/08/12 0740 12/08/12 1643 12/10/12 1410  WBC 10.7* 13.1* 15.3* 15.0*  NEUTROABS 7.9* 10.7*  --   --   HGB 12.0 12.5 12.7 12.8  HCT 40.8 41.6 41.5 40.3  MCV 91.3 91.2 90.2 90.0  PLT 263 246 278 307   Cardiac Enzymes: No results found for this basename: CKTOTAL, CKMB, CKMBINDEX, TROPONINI,  in  the last 168 hours BNP (last 3 results)  Recent Labs  12/08/12 0005  PROBNP 193.8*   CBG: No results found for this basename: GLUCAP,  in the last 168 hours  No results found for this or any previous visit (from the past 240 hour(s)).   Studies: No results found.  Scheduled Meds: . ipratropium  0.5 mg Nebulization Q4H   And  . albuterol  2.5 mg Nebulization Q4H  . ferrous sulfate  325 mg Oral TID WC  . furosemide  40 mg Oral Daily  . influenza vac split quadrivalent PF  0.5 mL Intramuscular Tomorrow-1000  . levofloxacin  750 mg Oral Daily  . predniSONE  50 mg Oral Q breakfast   Continuous Infusions:    Marinda Elk  [facility] Pager [phone]. If 8PM-8AM, please contact night-coverage at [email or web address], password [facility] 12/11/2012, 12:50 PM  LOS: 4  days

## 2012-12-11 NOTE — Care Management Note (Signed)
    Page 1 of 1   12/11/2012     2:17:06 PM   CARE MANAGEMENT NOTE 12/11/2012  Patient:  Isabel Roberts,Isabel Roberts   Account Number:  [id number]  Date Initiated:  12/11/2012  Documentation initiated by:  Tera Mater  Subjective/Objective Assessment:   31yo female admitted with CHF.  Pt. lives at home with friend     Action/Plan:   discharge planning   Anticipated DC Date:  12/12/2012   Anticipated DC Plan:  HOME W HOME HEALTH SERVICES  In-house referral  Clinical Social Worker      DC Planning Services  CM consult      Choice offered to / List presented to:             Status of service:  In process, will continue to follow Medicare Important Message given?   (If response is "NO", the following Medicare IM given date fields will be blank) Date Medicare IM given:   Date Additional Medicare IM given:    Discharge Disposition:    Per UR Regulation:  Reviewed for med. necessity/level of care/duration of stay  If discussed at Long Length of Stay Meetings, dates discussed:    Comments:  12/11/12 1345 In to speak with pt. about home health needs. 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 She states she has been approved for a Financial planner, however she did not know how many hours. Pt. also had some concerns about her home oxygen concentrator.  TC to [facility] [phone]) to make aware of pt. concerns, and this NCM gave the service rep pt.'s contact information to call for more information.  In addition, this NCM called [facility]) to make aware of pt. admission, and give referral. Pt. may dc tomorrow, and pt. will need ambulance transport for dc.  Will consult CSW for transportation needs. Tera Mater, RN, BSN NCM [phone]

## 2012-12-11 NOTE — Progress Notes (Signed)
Pt a/o, oob assist x 1-2 with walker, pt c/o diarrhea, MD notified stool sample sent down, will continue to monitor

## 2012-12-11 NOTE — Progress Notes (Signed)
Pt. Stated that she would place herself on CPAP before going to bed. Pt. Was made aware to call RT if she needed assistance with CPAP.  

## 2012-12-12 LAB — BASIC METABOLIC PANEL
CO2: 32 mEq/L (ref 19–32)
Calcium: 9 mg/dL (ref 8.4–10.5)
Creatinine, Ser: 0.63 mg/dL (ref 0.50–1.10)
GFR calc Af Amer: >90 mL/min (ref >90)
GFR calc non Af Amer: >90 mL/min (ref >90)
Sodium: 140 mEq/L (ref 135–145)

## 2012-12-12 LAB — PROTIME-INR
INR: 1.09 (ref 0.00–1.49)
Prothrombin Time: 13.9 seconds (ref 11.6–15.2)

## 2012-12-12 MED ORDER — FUROSEMIDE 40 MG PO TABS: 40.0000 mg | ORAL_TABLET | Freq: Every day | ORAL | Status: DC

## 2012-12-12 MED ORDER — LEVOFLOXACIN 750 MG PO TABS: 750.0000 mg | ORAL_TABLET | Freq: Every day | ORAL | Status: DC

## 2012-12-12 MED ORDER — PREDNISONE 10 MG PO TABS: ORAL_TABLET | ORAL | Status: DC

## 2012-12-12 NOTE — Progress Notes (Signed)
CSW (Clinical Child psychotherapist) informed pt will need ambulance transport home. CSW confirmed address with pt and called for [facility]. CSW signing off.   Tris Howell, LCSWA [phone]

## 2012-12-12 NOTE — Progress Notes (Signed)
Pt being d/c to home, pt going home on O2, pt given d/c instructions follow up medications and appointments given to pt, pt verbalized understanding, pt left via ambulance, pt stable

## 2012-12-12 NOTE — Discharge Summary (Signed)
Physician Discharge Summary  Isabel Roberts WUJ:[id number] DOB: 06/03/1980 DOA: 12/07/2012  PCP: [name], MD  Admit date: 12/07/2012 Discharge date: 12/12/2012  Time spent: 35 minutes minutes  Recommendations for Outpatient Follow-up:  1. Follow up with PCP  Discharge Diagnoses:  Principal Problem:   CAP (community acquired pneumonia) Active Problems:   Morbid obesity   OSA (obstructive sleep apnea)   Asthma   Elevated d-dimer   Bilateral lower extremity edema   Leukocytosis, unspecified   Discharge Condition: stable  Diet recommendation: Heart healthy  Filed Weights   12/07/12 2212 12/10/12 0845 12/11/12 0536  Weight: 362.424 kg (799 lb) 346.2 kg (763 lb 3.7 oz) 346.8 kg (764 lb 8.9 oz)    History of present illness:  32 y.o. female with Past medical history of morbid obesity, obstructive sleep apnea on BiPAP at home, recurrent pneumonias, history of asthma as a child on inhalers she presented today with the complaint of shortness of breath that has been progressively worsening since last few days. She mentions that she normally wears BiPAP along with oxygen but since last 3 days due to shortness of breath she has been using her oxygen even without BiPAP during days. She denies any chest pain or fever or chills but does mention that she has cough along with sputum expectoration which he mentions white to yellow without any blood. She felt nauseous but denies any vomiting or abdominal pain. She denies any urinary symptoms or constipation.  She feels that her legs are more swollen than her usual.  She mentions that she has been ambulating at home appropriately with a walker but since last few days has been having lethargy and had almost fall when she felt dizzy and lightheaded today. She denies any trauma to her head or neck. She denies any episode of loss of consciousness.   Hospital Course:  CAP:  - S. pneumo and legionella neg  - Rocephin and azithro started on 9.15.2014,   de-escalate to levaquin.  - worsening WBC is due to steroids.  - SOB improved.  - cont for 7 days total.  Acute asthma exacerbation  - Continue prednisone taper.  - Continue duonebs q4h.   Elevated D-dimer:  - Due to PNA, she had SOB (she is an asthmatic), not tachycardic.  - Unlikely PE, swelling of the legs resolved with lasix. Elevated d-dimer can be explained by the PNA.  - D/c anticoagulation.  - I have d/w with patient she understand the risk and benefit and will like to proceed with d/c anticoagulation.   OSA/OHS:  - Continue nightly bipap  - ECHO with poor windows.  - Lower extremity duplex nondiagnostic  - Strict I/O: -3.2L (neg 7.4L so far)  - change lasix to 40 mg daily.   Hyperkalemia:  - Resolved.  - due to supplements.  Morbid obesity:  - Nutrition consultation  - Once bedside commode established, please d/c foley  - PT/OT recommending home health with 3-in-1.    Procedures:  Doppler lower ext.  Consultations:  none  Discharge Exam: Filed Vitals:   12/12/12 0431  BP: 119/59  Pulse: 90  Temp: 97.9 F (36.6 C)  Resp: 18    General: A&O x3 Cardiovascular: RRR Respiratory: good air movement CTA B/L  Discharge Instructions      Discharge Orders   Future Orders Complete By Expires   Diet - low sodium heart healthy  As directed    Increase activity slowly  As directed  Medication List         albuterol 108 (90 BASE) MCG/ACT inhaler  Commonly known as:  PROVENTIL HFA;VENTOLIN HFA  Inhale 2 puffs into the lungs daily as needed for wheezing or shortness of breath.     ferrous sulfate 325 (65 FE) MG tablet  Take 325 mg by mouth 3 (three) times daily with meals.     furosemide 40 MG tablet  Commonly known as:  LASIX  Take 1 tablet (40 mg total) by mouth daily.     levofloxacin 750 MG tablet  Commonly known as:  LEVAQUIN  Take 1 tablet (750 mg total) by mouth daily.     naproxen sodium 220 MG tablet  Commonly known as:   ANAPROX  Take 220 mg by mouth daily as needed (pain).     norethindrone 5 MG tablet  Commonly known as:  AYGESTIN  Take 5 mg by mouth daily.     predniSONE 10 MG tablet  Commonly known as:  DELTASONE  Takes 6 tablets for 1 days, then 5 tablets for 1 days, then 4 tablets for 1 days, then 3 tablets for 1 days, then 2 tabs for 1 days, then 1 tab for 1 days, and then stop.     PRESCRIPTION MEDICATION  Apply 1 application topically 2 (two) times daily as needed (rash/dryness).     traMADol 50 MG tablet  Commonly known as:  ULTRAM  Take 150 mg by mouth 2 (two) times daily as needed for pain.     VITAMIN E PO  Take 1 capsule by mouth daily.       Allergies  Allergen Reactions  . Fish Allergy Hives and Other (See Comments)    Throat swells  . Other Other (See Comments)    Grass causes a rash  . Shrimp [Shellfish Allergy] Rash   Follow-up Information   Follow up with [name], MD In 2 weeks. (hospital follow up)    Specialty:  Family Medicine   Contact information:   [address] [place] [address] [phone]        The results of significant diagnostics from this hospitalization (including imaging, microbiology, ancillary and laboratory) are listed below for reference.    Significant Diagnostic Studies: Dg Chest Port 1 View  12/07/2012   CLINICAL DATA:  Shortness of breath.  EXAM: PORTABLE CHEST - 1 VIEW  COMPARISON:  No priors.  FINDINGS: Film is underpenetrated, limiting the diagnostic sensitivity and specificity the examination. With these limitations in mind, there do appear to be patchy interstitial and multifocal airspace opacities throughout the lungs, most confluent in the right mid to lower lung and at the left base, concerning for potential multilobar pneumonia. Sequela of aspiration could have a similar appearance. No definite scratch at no definite pleural effusions. No evidence of pulmonary edema. Heart size appears mildly enlarged. The  patient is rotated to the left on today's exam, resulting in distortion of the mediastinal contours and reduced diagnostic sensitivity and specificity for mediastinal pathology.  IMPRESSION: 1. Multifocal patchy interstitial and airspace disease in the lung lungs, as above, concerning for multilobar pneumonia or sequela of aspiration. 2. Mild cardiomegaly.   Electronically Signed   By: Trudie Reed M.D.   On: 12/07/2012 22:48    Microbiology: Recent Results (from the past 240 hour(s))  CLOSTRIDIUM DIFFICILE BY PCR     Status: None   Collection Time    12/11/12  1:21 PM      Result  Value Range Status   C difficile by pcr NEGATIVE  NEGATIVE Final     Labs: Basic Metabolic Panel:  Recent Labs Lab 12/08/12 0740 12/09/12 0740 12/10/12 0725 12/11/12 0500 12/12/12 0415  NA 140 138 138 137 140  K 4.2 5.8* 4.2 3.6 4.2  CL 101 97 97 95* 97  CO2 34* 35* 33* 36* 32  GLUCOSE 109* 166* 138* 101* 100*  BUN 11 11 13 17 15   CREATININE 0.70 0.58 0.66 0.75 0.63  CALCIUM 9.1 9.4 9.2 9.0 9.0   Liver Function Tests:  Recent Labs Lab 12/08/12 0740  AST 17  ALT 19  ALKPHOS 148*  BILITOT 0.6  PROT 7.3  ALBUMIN 3.5   No results found for this basename: LIPASE, AMYLASE,  in the last 168 hours No results found for this basename: AMMONIA,  in the last 168 hours CBC:  Recent Labs Lab 12/08/12 0005 12/08/12 0740 12/08/12 1643 12/10/12 1410  WBC 10.7* 13.1* 15.3* 15.0*  NEUTROABS 7.9* 10.7*  --   --   HGB 12.0 12.5 12.7 12.8  HCT 40.8 41.6 41.5 40.3  MCV 91.3 91.2 90.2 90.0  PLT 263 246 278 307   Cardiac Enzymes: No results found for this basename: CKTOTAL, CKMB, CKMBINDEX, TROPONINI,  in the last 168 hours BNP: BNP (last 3 results)  Recent Labs  12/08/12 0005  PROBNP 193.8*   CBG: No results found for this basename: GLUCAP,  in the last 168 hours     Signed:  Marinda Elk  [facility] 12/12/2012, 8:31 AM

## 2012-12-16 ENCOUNTER — Encounter (HOSPITAL_COMMUNITY): Payer: Self-pay

## 2012-12-16 ENCOUNTER — Inpatient Hospital Stay (HOSPITAL_COMMUNITY)
Admission: EM | Admit: 2012-12-16 | Discharge: 2012-12-20 | DRG: 291 | Disposition: A | Discharge status: Home Health | Payer: Medicaid Other | Attending: Internal Medicine | Admitting: Internal Medicine

## 2012-12-16 ENCOUNTER — Emergency Department (HOSPITAL_COMMUNITY): Payer: Medicaid Other

## 2012-12-16 DIAGNOSIS — I509 Heart failure, unspecified: Secondary | ICD-10-CM | POA: Diagnosis present

## 2012-12-16 DIAGNOSIS — J45909 Unspecified asthma, uncomplicated: Secondary | ICD-10-CM | POA: Diagnosis present

## 2012-12-16 DIAGNOSIS — J9600 Acute respiratory failure, unspecified whether with hypoxia or hypercapnia: Secondary | ICD-10-CM

## 2012-12-16 DIAGNOSIS — R7989 Other specified abnormal findings of blood chemistry: Secondary | ICD-10-CM

## 2012-12-16 DIAGNOSIS — J189 Pneumonia, unspecified organism: Secondary | ICD-10-CM | POA: Diagnosis present

## 2012-12-16 DIAGNOSIS — D72829 Elevated white blood cell count, unspecified: Secondary | ICD-10-CM

## 2012-12-16 DIAGNOSIS — R600 Localized edema: Secondary | ICD-10-CM

## 2012-12-16 DIAGNOSIS — I5033 Acute on chronic diastolic (congestive) heart failure: Principal | ICD-10-CM | POA: Diagnosis present

## 2012-12-16 DIAGNOSIS — E662 Morbid (severe) obesity with alveolar hypoventilation: Secondary | ICD-10-CM | POA: Diagnosis present

## 2012-12-16 DIAGNOSIS — J9620 Acute and chronic respiratory failure, unspecified whether with hypoxia or hypercapnia: Secondary | ICD-10-CM | POA: Diagnosis present

## 2012-12-16 DIAGNOSIS — G4733 Obstructive sleep apnea (adult) (pediatric): Secondary | ICD-10-CM | POA: Diagnosis present

## 2012-12-16 DIAGNOSIS — Z6841 Body Mass Index (BMI) 40.0 and over, adult: Secondary | ICD-10-CM

## 2012-12-16 LAB — CBC WITH DIFFERENTIAL/PLATELET
Basophils Absolute: 0 10*3/uL (ref 0.0–0.1)
Basophils Relative: 0 % (ref 0–1)
Eosinophils Absolute: 0.2 10*3/uL (ref 0.0–0.7)
Eosinophils Relative: 1 % (ref 0–5)
HCT: 42.4 % (ref 36.0–46.0)
MCH: 27.1 pg (ref 26.0–34.0)
MCHC: 30 g/dL (ref 30.0–36.0)
MCV: 90.6 fL (ref 78.0–100.0)
Monocytes Absolute: 1.3 10*3/uL — ABNORMAL HIGH (ref 0.1–1.0)
Neutrophils Relative %: 81 % — ABNORMAL HIGH (ref 43–77)
RDW: 15.2 % (ref 11.5–15.5)

## 2012-12-16 LAB — BLOOD GAS, ARTERIAL
Acid-Base Excess: 4.9 mmol/L — ABNORMAL HIGH (ref 0.0–2.0)
Bicarbonate: 32.9 mEq/L — ABNORMAL HIGH (ref 20.0–24.0)
O2 Saturation: 98.9 %
Patient temperature: 37
RATE: 15 resp/min
TCO2: 30.1 mmol/L (ref 0–100)
pH, Arterial: 7.302 — ABNORMAL LOW (ref 7.350–7.450)

## 2012-12-16 LAB — COMPREHENSIVE METABOLIC PANEL
ALT: 38 U/L — ABNORMAL HIGH (ref 0–35)
AST: 14 U/L (ref 0–37)
Albumin: 3.4 g/dL — ABNORMAL LOW (ref 3.5–5.2)
BUN: 9 mg/dL (ref 6–23)
CO2: 32 mEq/L (ref 19–32)
Calcium: 9.1 mg/dL (ref 8.4–10.5)
Creatinine, Ser: 0.77 mg/dL (ref 0.50–1.10)
GFR calc non Af Amer: >90 mL/min (ref >90)
Total Protein: 6.9 g/dL (ref 6.0–8.3)

## 2012-12-16 LAB — URINALYSIS, ROUTINE W REFLEX MICROSCOPIC
Bilirubin Urine: NEGATIVE
Glucose, UA: NEGATIVE mg/dL
Hgb urine dipstick: NEGATIVE
Leukocytes, UA: NEGATIVE
Protein, ur: NEGATIVE mg/dL
Specific Gravity, Urine: 1.014 (ref 1.005–1.030)
Urobilinogen, UA: 0.2 mg/dL (ref 0.0–1.0)
pH: 6 (ref 5.0–8.0)

## 2012-12-16 LAB — PRO B NATRIURETIC PEPTIDE: Pro B Natriuretic peptide (BNP): 41.4 pg/mL (ref 0–125)

## 2012-12-16 LAB — PREGNANCY, URINE: Preg Test, Ur: NEGATIVE

## 2012-12-16 LAB — CG4 I-STAT (LACTIC ACID): Lactic Acid, Venous: 1.79 mmol/L (ref 0.5–2.2)

## 2012-12-16 LAB — PROTIME-INR: INR: 1.08 (ref 0.00–1.49)

## 2012-12-16 MED ORDER — VANCOMYCIN HCL 10 G IV SOLR
2500.0000 mg | Freq: Once | INTRAVENOUS | Status: AC
  Administered 2012-12-16: 2500 mg via INTRAVENOUS
  Filled 2012-12-16: qty 2500

## 2012-12-16 MED ORDER — IPRATROPIUM BROMIDE 0.02 % IN SOLN
0.5000 mg | RESPIRATORY_TRACT | Status: DC
  Administered 2012-12-16: 0.5 mg via RESPIRATORY_TRACT
  Filled 2012-12-16: qty 2.5

## 2012-12-16 MED ORDER — IPRATROPIUM BROMIDE 0.02 % IN SOLN: 0.5000 mg | RESPIRATORY_TRACT | Status: DC

## 2012-12-16 MED ORDER — ALBUTEROL SULFATE (5 MG/ML) 0.5% IN NEBU
10.0000 mg | INHALATION_SOLUTION | RESPIRATORY_TRACT | Status: DC
  Administered 2012-12-16: 10 mg via RESPIRATORY_TRACT
  Filled 2012-12-16: qty 2

## 2012-12-16 MED ORDER — ONDANSETRON HCL 4 MG PO TABS: 4.0000 mg | ORAL_TABLET | Freq: Four times a day (QID) | ORAL | Status: DC | PRN

## 2012-12-16 MED ORDER — ALBUTEROL SULFATE (5 MG/ML) 0.5% IN NEBU
2.5000 mg | INHALATION_SOLUTION | Freq: Four times a day (QID) | RESPIRATORY_TRACT | Status: DC
  Administered 2012-12-17 – 2012-12-20 (×16): 2.5 mg via RESPIRATORY_TRACT
  Filled 2012-12-16 (×16): qty 0.5

## 2012-12-16 MED ORDER — FERROUS SULFATE 325 (65 FE) MG PO TABS
325.0000 mg | ORAL_TABLET | Freq: Three times a day (TID) | ORAL | Status: DC
  Administered 2012-12-17 – 2012-12-20 (×9): 325 mg via ORAL
  Filled 2012-12-16 (×13): qty 1

## 2012-12-16 MED ORDER — ENOXAPARIN SODIUM 150 MG/ML ~~LOC~~ SOLN: 1.0000 mg/kg | Freq: Once | SUBCUTANEOUS | Status: DC

## 2012-12-16 MED ORDER — DEXTROSE 5 % IV SOLN
2.0000 g | Freq: Once | INTRAVENOUS | Status: AC
  Administered 2012-12-16: 2 g via INTRAVENOUS
  Filled 2012-12-16: qty 2

## 2012-12-16 MED ORDER — FUROSEMIDE 10 MG/ML IJ SOLN
40.0000 mg | Freq: Once | INTRAMUSCULAR | Status: AC
  Administered 2012-12-16: 40 mg via INTRAVENOUS
  Filled 2012-12-16: qty 4

## 2012-12-16 MED ORDER — VANCOMYCIN HCL IN DEXTROSE 1-5 GM/200ML-% IV SOLN: 1000.0000 mg | Freq: Once | INTRAVENOUS | Status: DC

## 2012-12-16 MED ORDER — ACETAMINOPHEN 325 MG PO TABS
650.0000 mg | ORAL_TABLET | Freq: Four times a day (QID) | ORAL | Status: DC | PRN
  Administered 2012-12-18: 650 mg via ORAL
  Filled 2012-12-16: qty 2

## 2012-12-16 MED ORDER — FUROSEMIDE 10 MG/ML IJ SOLN
40.0000 mg | Freq: Every day | INTRAMUSCULAR | Status: DC
  Administered 2012-12-17 – 2012-12-20 (×4): 40 mg via INTRAVENOUS
  Filled 2012-12-16 (×4): qty 4

## 2012-12-16 MED ORDER — SODIUM CHLORIDE 0.9 % IJ SOLN
3.0000 mL | Freq: Two times a day (BID) | INTRAMUSCULAR | Status: DC
  Administered 2012-12-17 – 2012-12-18 (×2): 3 mL via INTRAVENOUS
  Administered 2012-12-18: 12:00:00 via INTRAVENOUS
  Administered 2012-12-18 – 2012-12-20 (×3): 3 mL via INTRAVENOUS

## 2012-12-16 MED ORDER — SODIUM CHLORIDE 0.9 % IV SOLN
Freq: Once | INTRAVENOUS | Status: AC
  Administered 2012-12-16: 18:00:00 via INTRAVENOUS

## 2012-12-16 MED ORDER — SODIUM CHLORIDE 0.9 % IJ SOLN
3.0000 mL | Freq: Two times a day (BID) | INTRAMUSCULAR | Status: DC
  Administered 2012-12-18 – 2012-12-19 (×2): 3 mL via INTRAVENOUS

## 2012-12-16 MED ORDER — HEPARIN BOLUS VIA INFUSION
7000.0000 [IU] | Freq: Once | INTRAVENOUS | Status: AC
  Administered 2012-12-16: 7000 [IU] via INTRAVENOUS
  Filled 2012-12-16: qty 7000

## 2012-12-16 MED ORDER — PANTOPRAZOLE SODIUM 40 MG PO TBEC
40.0000 mg | DELAYED_RELEASE_TABLET | Freq: Every day | ORAL | Status: DC
  Administered 2012-12-17 – 2012-12-20 (×4): 40 mg via ORAL
  Filled 2012-12-16 (×5): qty 1

## 2012-12-16 MED ORDER — ALBUTEROL SULFATE (5 MG/ML) 0.5% IN NEBU: 2.5000 mg | INHALATION_SOLUTION | RESPIRATORY_TRACT | Status: DC

## 2012-12-16 MED ORDER — VITAMINS A & D EX OINT
TOPICAL_OINTMENT | CUTANEOUS | Status: AC
  Filled 2012-12-16: qty 5

## 2012-12-16 MED ORDER — ACETAMINOPHEN 650 MG RE SUPP: 650.0000 mg | Freq: Four times a day (QID) | RECTAL | Status: DC | PRN

## 2012-12-16 MED ORDER — VANCOMYCIN HCL IN DEXTROSE 1-5 GM/200ML-% IV SOLN
1000.0000 mg | Freq: Two times a day (BID) | INTRAVENOUS | Status: DC
  Administered 2012-12-17: 1000 mg via INTRAVENOUS
  Filled 2012-12-16: qty 200

## 2012-12-16 MED ORDER — BUDESONIDE 0.25 MG/2ML IN SUSP
0.2500 mg | Freq: Two times a day (BID) | RESPIRATORY_TRACT | Status: DC
  Administered 2012-12-17 – 2012-12-20 (×8): 0.25 mg via RESPIRATORY_TRACT
  Filled 2012-12-16 (×16): qty 2

## 2012-12-16 MED ORDER — HEPARIN (PORCINE) IN NACL 100-0.45 UNIT/ML-% IJ SOLN
2000.0000 [IU]/h | INTRAMUSCULAR | Status: DC
  Administered 2012-12-16: 2000 [IU]/h via INTRAVENOUS
  Filled 2012-12-16 (×2): qty 250

## 2012-12-16 MED ORDER — PREDNISONE 20 MG PO TABS
40.0000 mg | ORAL_TABLET | Freq: Every day | ORAL | Status: DC
  Administered 2012-12-17 – 2012-12-18 (×2): 40 mg via ORAL
  Filled 2012-12-16 (×3): qty 2

## 2012-12-16 MED ORDER — ONDANSETRON HCL 4 MG/2ML IJ SOLN: 4.0000 mg | Freq: Four times a day (QID) | INTRAMUSCULAR | Status: DC | PRN

## 2012-12-16 MED ORDER — ALBUTEROL SULFATE (5 MG/ML) 0.5% IN NEBU: 2.5000 mg | INHALATION_SOLUTION | RESPIRATORY_TRACT | Status: DC | PRN

## 2012-12-16 MED ORDER — DEXTROSE 5 % IV SOLN
2.0000 g | Freq: Two times a day (BID) | INTRAVENOUS | Status: DC
  Administered 2012-12-17 – 2012-12-19 (×5): 2 g via INTRAVENOUS
  Filled 2012-12-16 (×6): qty 2

## 2012-12-16 MED ORDER — TRAMADOL HCL 50 MG PO TABS
150.0000 mg | ORAL_TABLET | Freq: Two times a day (BID) | ORAL | Status: DC | PRN
  Administered 2012-12-17: 150 mg via ORAL
  Filled 2012-12-16: qty 3

## 2012-12-16 NOTE — ED Notes (Signed)
Per [facility]- Pt presents 800LBS- SOB for 12 hours denies CP- recent discharge from [facility].  DX pneumonia. Breathing tx in route. 02 sat on scene 64% breathing tx after 100%. Rhonchi throughout.  Denies fever and pain

## 2012-12-16 NOTE — ED Provider Notes (Signed)
Angiocath insertion Performed by: Dierdre Forth  Consent: Verbal consent obtained. Risks and benefits: risks, benefits and alternatives were discussed Time out: Immediately prior to procedure a "time out" was called to verify the correct patient, procedure, equipment, support staff and site/side marked as required.  Preparation: Patient was prepped and draped in the usual sterile fashion.  Vein Location: L forearm  Ultrasound Guided   Gauge: 20ga  Normal blood return and flush without difficulty Patient tolerance: Patient tolerated the procedure well with no immediate complications.     Isabel Client Decarlos Empey, PA-C 12/16/12 2013

## 2012-12-16 NOTE — ED Notes (Signed)
MD at bedside. 

## 2012-12-16 NOTE — ED Notes (Signed)
Bed: RESA Expected date:  Expected time:  Means of arrival:  Comments: 800 lb pt, SOB

## 2012-12-16 NOTE — H&P (Addendum)
[facility] History and Physical  Isabel Roberts WUJ:[id number] DOB: 1980/09/26 DOA: 12/16/2012  Referring physician: ER physician. PCP: [name], MD   Chief Complaint: Shortness of breath.  HPI: Isabel Roberts is a 32 y.o. female with known history of obstructive sleep apnea/obesity hypoventilation syndrome on BiPAP at home, asthma and morbid obesity who was recently discharged from hospital after being treated for pneumonia presents with complaints of persistent shortness of breath since last evening. Patient stated that she was short of breath at rest and on exertion. Denies any chest pain. Shortness of breath was associated with productive cough and wheezing. In the ER patient was found to be hypoxic and was placed on BiPAP initially on 100% presently FiO2 is 70% and patient is able to talk comfortably. Patient states her shortness of breath has improved since coming to the ER but still feels congested. Chest x-ray shows congestion and possible pneumonia. During the last admission patient had Dopplers done of the lower extremities which was nonconclusive and at that time patient d-dimer was found to be elevated. 2-D echo showed EF of 60% and patient was diuresed with IV Lasix and discharged home on Lasix 40 daily which patient states she has been taking. Patient will be admitted for further management. Patient has been started on empiric antibiotics for health care associated pneumonia and a dose of IV Lasix has been given. Patient also has been started on IV heparin completely for possible DVT/PE given that patient cannot have CT angiogram or other studies due to the weight limitation. Patient has been having diarrhea since the last discharge. Denies any nausea vomiting or abdominal pain.  Review of Systems: As presented in the history of presenting illness, rest negative.  Past Medical History  Diagnosis Date  . Sleep apnea   . Pneumonia   . Obesity   . Vaginal bleeding   . Pneumonia     Past Surgical History  Procedure Laterality Date  . Vagina surgery  09/2011   Social History:  reports that she has never smoked. She does not have any smokeless tobacco history on file. She reports that she does not drink alcohol or use illicit drugs. Home. where does patient live-- Yes Can patient participate in ADLs?  Allergies  Allergen Reactions  . Fish Allergy Hives and Other (See Comments)    Throat swells  . Other Other (See Comments)    Grass causes a rash  . Shrimp [Shellfish Allergy] Rash    Family History  Problem Relation Age of Onset  . Diabetes Mother   . Hypertension Mother   . Hypertension Father   . Diabetes Father       Prior to Admission medications   Medication Sig Start Date End Date Taking? Authorizing Provider  albuterol (PROVENTIL HFA;VENTOLIN HFA) 108 (90 BASE) MCG/ACT inhaler Inhale 2 puffs into the lungs daily as needed for wheezing or shortness of breath.    Historical Provider, MD  ferrous sulfate 325 (65 FE) MG tablet Take 325 mg by mouth 3 (three) times daily with meals.    Historical Provider, MD  furosemide (LASIX) 40 MG tablet Take 1 tablet (40 mg total) by mouth daily. 12/12/12   Marinda Elk, MD  levofloxacin (LEVAQUIN) 750 MG tablet Take 1 tablet (750 mg total) by mouth daily. 12/12/12   Marinda Elk, MD  naproxen sodium (ANAPROX) 220 MG tablet Take 220 mg by mouth daily as needed (pain).    Historical Provider, MD  norethindrone (AYGESTIN) 5 MG tablet  Take 5 mg by mouth daily.    Historical Provider, MD  predniSONE (DELTASONE) 10 MG tablet Takes 6 tablets for 1 days, then 5 tablets for 1 days, then 4 tablets for 1 days, then 3 tablets for 1 days, then 2 tabs for 1 days, then 1 tab for 1 days, and then stop. 12/12/12   Marinda Elk, MD  PRESCRIPTION MEDICATION Apply 1 application topically 2 (two) times daily as needed (rash/dryness).    Historical Provider, MD  traMADol (ULTRAM) 50 MG tablet Take 150 mg by mouth 2 (two)  times daily as needed for pain.    Historical Provider, MD  VITAMIN E PO Take 1 capsule by mouth daily.    Historical Provider, MD   Physical Exam: Filed Vitals:   12/16/12 1830 12/16/12 1926 12/16/12 1930 12/16/12 2042  BP: 134/70 120/70 130/75 121/60  Pulse: 122 111 117 111  Temp:      TempSrc:      Resp: 27 32 22 22  Height:      Weight:      SpO2: 99%  96% 98%     General:  Well-developed well-nourished. Morbidly obese.  Eyes: Anicteric no pallor.  ENT: No discharge from the ears eyes nose mouth.  Neck: No mass felt.  Cardiovascular: S1-S2 heard.  Respiratory: No rhonchi or crepitations.  Abdomen: Soft nontender bowel sounds present.  Skin: No rash.  Musculoskeletal: No edema.  Psychiatric: Appears normal.  Neurologic: Alert awake oriented to time place and person. Moves all extremities.  Labs on Admission:  Basic Metabolic Panel:  Recent Labs Lab 12/10/12 0725 12/11/12 0500 12/12/12 0415 12/16/12 1745  NA 138 137 140 139  K 4.2 3.6 4.2 4.1  CL 97 95* 97 98  CO2 33* 36* 32 32  GLUCOSE 138* 101* 100* 108*  BUN 13 17 15 9   CREATININE 0.66 0.75 0.63 0.77  CALCIUM 9.2 9.0 9.0 9.1   Liver Function Tests:  Recent Labs Lab 12/16/12 1745  AST 14  ALT 38*  ALKPHOS 128*  BILITOT 0.4  PROT 6.9  ALBUMIN 3.4*   No results found for this basename: LIPASE, AMYLASE,  in the last 168 hours No results found for this basename: AMMONIA,  in the last 168 hours CBC:  Recent Labs Lab 12/10/12 1410 12/16/12 1745  WBC 15.0* 15.6*  NEUTROABS  --  12.6*  HGB 12.8 12.7  HCT 40.3 42.4  MCV 90.0 90.6  PLT 307 244   Cardiac Enzymes: No results found for this basename: CKTOTAL, CKMB, CKMBINDEX, TROPONINI,  in the last 168 hours  BNP (last 3 results)  Recent Labs  12/08/12 0005 12/16/12 1654  PROBNP 193.8* 41.4   CBG: No results found for this basename: GLUCAP,  in the last 168 hours  Radiological Exams on Admission: Dg Chest Port 1  View  12/16/2012   CLINICAL DATA:  Shortness of breath  EXAM: PORTABLE CHEST - 1 VIEW  COMPARISON:  December 07, 2012  FINDINGS: There is pulmonary edema. There is consolidation of right lung base, superimposed pneumonia is not excluded. The heart size is enlarged. Mediastinal contour is enlarged. The exam is limited by patient motion.  IMPRESSION: Congestive heart failure. Patchy consolidation of right lung base superimposed pneumonia is not excluded.   Electronically Signed   By: Sherian Rein   On: 12/16/2012 17:51     Assessment/Plan Principal Problem:   Acute respiratory failure Active Problems:   OSA (obstructive sleep apnea)   Asthma  Pneumonia   1. Acute hypoxic respiratory failure - most likely secondary to pneumonia with possible CHF. Continue with vancomycin and cefepime and IV Lasix. Blood cultures have been ordered. Patient will be continued on BiPAP. Patient also has been placed on IV heparin for possible PE/DVT since patient's last admission showed nonconclusive Doppler study of the lower extremity and patient has weight limitations to do further studies. Will discuss with pulmonary. 2. Diarrhea - patient states she has been having diarrhea since last discharge. Check stool for C. difficile. 3. Asthma - presently not wheezing. Continue nebulizer and Pulmicort. Patient was on tapering dose of steroids which will be continued. 4. OSA on BiPAP.    Code Status: Full code.  Family Communication: None.  Disposition Plan: Admit to inpatient.    KAKRAKANDY,ARSHAD N. [facility] Pager [phone].  If 7PM-7AM, please contact night-coverage [email or web address] Password [email or web address] 12/16/2012, 8:49 PM

## 2012-12-16 NOTE — ED Provider Notes (Signed)
CSN: [id number]     Arrival date & time 12/16/12  1610 History   First MD Initiated Contact with Patient 12/16/12 1629     Chief Complaint  Patient presents with  . Shortness of Breath   (Consider location/radiation/quality/duration/timing/severity/associated sxs/prior Treatment) HPI Comments: Patient is a morbidly obese 32 year old female who presents today with worsening shortness of breath. She was recently admitted to the hospital and discharged on 9/19 for CAP. During that time was given IV antibiotics and weaned to Levaquin. She has been taking Levaquin as prescribed and tomorrow will be her last dose. She was unable to get a CTA of her chest due to her weight. She was anticoagulated in the hospital, but sent home without anticoagulation. Her elevated d-dimer was blamed on pneumonia. She denies any history of any blood clots in the past. Today she suddenly became short of breath. She continues to spit up green sputum. Per the EMS report her oxygen saturation was 64% on room air, but responded well to oxygen. She reports that she was up cleaning the kitchen yesterday, but has not gotten around much today. She uses a walker to ambulate.   Patient is a 32 y.o. female presenting with shortness of breath. The history is provided by the patient. No language interpreter was used.  Shortness of Breath Associated symptoms: wheezing   Associated symptoms: no abdominal pain, no chest pain and no vomiting     Past Medical History  Diagnosis Date  . Sleep apnea   . Pneumonia   . Obesity   . Vaginal bleeding   . Pneumonia    Past Surgical History  Procedure Laterality Date  . Vagina surgery  09/2011   Family History  Problem Relation Age of Onset  . Diabetes Mother   . Hypertension Mother   . Hypertension Father   . Diabetes Father    History  Substance Use Topics  . Smoking status: Never Smoker   . Smokeless tobacco: Not on file  . Alcohol Use: No   OB History   Grav Para Term  Preterm Abortions TAB SAB Ect Mult Living   1 1        0     Review of Systems  Respiratory: Positive for shortness of breath and wheezing.   Cardiovascular: Negative for chest pain.  Gastrointestinal: Negative for nausea, vomiting and abdominal pain.  All other systems reviewed and are negative.    Allergies  Fish allergy; Other; and Shrimp  Home Medications   Current Outpatient Rx  Name  Route  Sig  Dispense  Refill  . albuterol (PROVENTIL HFA;VENTOLIN HFA) 108 (90 BASE) MCG/ACT inhaler   Inhalation   Inhale 2 puffs into the lungs daily as needed for wheezing or shortness of breath.         . ferrous sulfate 325 (65 FE) MG tablet   Oral   Take 325 mg by mouth 3 (three) times daily with meals.         . furosemide (LASIX) 40 MG tablet   Oral   Take 1 tablet (40 mg total) by mouth daily.   30 tablet   0   . levofloxacin (LEVAQUIN) 750 MG tablet   Oral   Take 1 tablet (750 mg total) by mouth daily.   5 tablet   0   . naproxen sodium (ANAPROX) 220 MG tablet   Oral   Take 220 mg by mouth daily as needed (pain).         Marland Kitchen  norethindrone (AYGESTIN) 5 MG tablet   Oral   Take 5 mg by mouth daily.         . predniSONE (DELTASONE) 10 MG tablet      Takes 6 tablets for 1 days, then 5 tablets for 1 days, then 4 tablets for 1 days, then 3 tablets for 1 days, then 2 tabs for 1 days, then 1 tab for 1 days, and then stop.   21 tablet   0   . PRESCRIPTION MEDICATION   Topical   Apply 1 application topically 2 (two) times daily as needed (rash/dryness).         . traMADol (ULTRAM) 50 MG tablet   Oral   Take 150 mg by mouth 2 (two) times daily as needed for pain.         Marland Kitchen VITAMIN E PO   Oral   Take 1 capsule by mouth daily.          BP 151/84  Pulse 124  Temp(Src) 100.9 F (38.3 C) (Axillary)  Resp 53  Ht 5\' 4"  (1.626 m)  Wt 799 lb (362.424 kg)  BMI 137.08 kg/m2  SpO2 100%  LMP 12/07/2012 Physical Exam  Nursing note and vitals  reviewed. Constitutional: She is oriented to person, place, and time. She appears well-developed and well-nourished. No distress.  HENT:  Head: Normocephalic and atraumatic.  Right Ear: External ear normal.  Left Ear: External ear normal.  Nose: Nose normal.  Mouth/Throat: Oropharynx is clear and moist.  Eyes: Conjunctivae are normal.  Neck: Normal range of motion.  Cardiovascular: Normal rate, regular rhythm and normal heart sounds.   Pulmonary/Chest: No stridor. Tachypnea noted. She is in respiratory distress. She has wheezes. She has rhonchi. She has rales.  Abdominal: Soft. She exhibits no distension.  Musculoskeletal: Normal range of motion.  Neurological: She is alert and oriented to person, place, and time. She has normal strength.  Skin: Skin is warm and dry. She is not diaphoretic. No erythema.  Psychiatric: She has a normal mood and affect. Her behavior is normal.    ED Course  Procedures (including critical care time) Labs Review Labs Reviewed  CBC WITH DIFFERENTIAL - Abnormal; Notable for the following:    WBC 15.6 (*)    Neutrophils Relative % 81 (*)    Neutro Abs 12.6 (*)    Lymphocytes Relative 9 (*)    Monocytes Absolute 1.3 (*)    All other components within normal limits  COMPREHENSIVE METABOLIC PANEL - Abnormal; Notable for the following:    Glucose, Bld 108 (*)    Albumin 3.4 (*)    ALT 38 (*)    Alkaline Phosphatase 128 (*)    All other components within normal limits  BLOOD GAS, ARTERIAL - Abnormal; Notable for the following:    pH, Arterial 7.302 (*)    pCO2 arterial 68.6 (*)    pO2, Arterial 168.0 (*)    Bicarbonate 32.9 (*)    Acid-Base Excess 4.9 (*)    All other components within normal limits  CBC - Abnormal; Notable for the following:    WBC 16.0 (*)    All other components within normal limits  BASIC METABOLIC PANEL - Abnormal; Notable for the following:    CO2 35 (*)    Glucose, Bld 112 (*)    All other components within normal limits   MRSA PCR SCREENING  CULTURE, BLOOD (ROUTINE X 2)  CULTURE, BLOOD (ROUTINE X 2)  URINE CULTURE  CLOSTRIDIUM DIFFICILE  BY PCR  URINALYSIS, ROUTINE W REFLEX MICROSCOPIC  PROTIME-INR  PRO B NATRIURETIC PEPTIDE  TROPONIN I  TROPONIN I  TROPONIN I  PREGNANCY, URINE  CG4 I-STAT (LACTIC ACID)   Imaging Review Dg Chest Port 1 View  12/16/2012   CLINICAL DATA:  Shortness of breath  EXAM: PORTABLE CHEST - 1 VIEW  COMPARISON:  December 07, 2012  FINDINGS: There is pulmonary edema. There is consolidation of right lung base, superimposed pneumonia is not excluded. The heart size is enlarged. Mediastinal contour is enlarged. The exam is limited by patient motion.  IMPRESSION: Congestive heart failure. Patchy consolidation of right lung base superimposed pneumonia is not excluded.   Electronically Signed   By: Sherian Rein   On: 12/16/2012 17:51    MDM   1. Acute respiratory failure   2. Asthma   3. OSA (obstructive sleep apnea)   4. Pneumonia   5. Acute on chronic diastolic CHF (congestive heart failure)   6. HCAP (healthcare-associated pneumonia)    Patient presents in respiratory distress. Sepsis with PNA as suspected source of infection protocol was followed in ED. Dr. Wilkie Aye evaluated patient and agrees with plan. Pt admitted to step down.   Medications  ceFEPIme (MAXIPIME) 2 g in dextrose 5 % 50 mL IVPB (2 g Intravenous Given 12/17/12 0539)  ferrous sulfate tablet 325 mg (325 mg Oral Given 12/17/12 1133)  traMADol (ULTRAM) tablet 150 mg (150 mg Oral Given 12/17/12 0823)  furosemide (LASIX) injection 40 mg (40 mg Intravenous Given 12/17/12 0951)  budesonide (PULMICORT) nebulizer solution 0.25 mg (0.25 mg Nebulization Given 12/17/12 0751)  acetaminophen (TYLENOL) tablet 650 mg (not administered)    Or  acetaminophen (TYLENOL) suppository 650 mg (not administered)  ondansetron (ZOFRAN) tablet 4 mg (not administered)    Or  ondansetron (ZOFRAN) injection 4 mg (not administered)  sodium  chloride 0.9 % injection 3 mL (3 mLs Intravenous Not Given 12/17/12 0952)  sodium chloride 0.9 % injection 3 mL (3 mLs Intravenous Not Given 12/17/12 0952)  albuterol (PROVENTIL) (5 MG/ML) 0.5% nebulizer solution 2.5 mg (2.5 mg Nebulization Given 12/17/12 1422)  albuterol (PROVENTIL) (5 MG/ML) 0.5% nebulizer solution 2.5 mg (not administered)  predniSONE (DELTASONE) tablet 40 mg (40 mg Oral Given 12/17/12 0820)  pantoprazole (PROTONIX) EC tablet 40 mg (40 mg Oral Given 12/17/12 0951)  vitamin A & D ointment (  Not Given 12/16/12 2345)  chlorhexidine (PERIDEX) 0.12 % solution (  Canceled Entry 12/17/12 0827)  chlorhexidine (PERIDEX) 0.12 % solution 15 mL (15 mLs Mouth Rinse Given 12/17/12 0827)  antiseptic oral rinse (BIOTENE) solution 15 mL (15 mLs Mouth Rinse Given 12/17/12 1518)  enoxaparin (LOVENOX) injection 150 mg (not administered)  vancomycin (VANCOCIN) 2,000 mg in sodium chloride 0.9 % 500 mL IVPB (not administered)  ceFEPIme (MAXIPIME) 2 g in dextrose 5 % 50 mL IVPB (0 g Intravenous Stopped 12/16/12 1841)  vancomycin (VANCOCIN) 2,500 mg in sodium chloride 0.9 % 500 mL IVPB (2,500 mg Intravenous Restarted 12/16/12 2013)  0.9 %  sodium chloride infusion ( Intravenous New Bag/Given 12/16/12 1811)  furosemide (LASIX) injection 40 mg (40 mg Intravenous Given 12/16/12 2004)  heparin bolus via infusion 7,000 Units (7,000 Units Intravenous Given 12/16/12 2214)  vancomycin (VANCOCIN) IVPB 1000 mg/200 mL premix (1,000 mg Intravenous Given 12/17/12 1058)       Mora Bellman, PA-C 12/17/12 1555

## 2012-12-16 NOTE — Progress Notes (Signed)
ANTICOAGULATION CONSULT NOTE - Initial Consult  Pharmacy Consult for Heparin  Indication: r/o PE  Allergies  Allergen Reactions  . Fish Allergy Hives and Other (See Comments)    Throat swells  . Other Other (See Comments)    Grass causes a rash  . Shrimp [Shellfish Allergy] Rash    Patient Measurements: Height: 5\' 4"  (162.6 cm) Weight: 799 lb (362.424 kg) IBW/kg (Calculated) : 54.7 Heparin Dosing Weight: 156.53  Vital Signs: Temp: 100.9 F (38.3 C) (09/23 1644) Temp src: Axillary (09/23 1644) BP: 151/84 mmHg (09/23 1635) Pulse Rate: 124 (09/23 1635)  Labs:  Recent Labs  12/16/12 1745  HGB 12.7  HCT 42.4  PLT 244  LABPROT 13.8  INR 1.08  CREATININE 0.77    Estimated Creatinine Clearance: 288.6 ml/min (by C-G formula based on Cr of 0.77).   Medical History: Past Medical History  Diagnosis Date  . Sleep apnea   . Pneumonia   . Obesity   . Vaginal bleeding   . Pneumonia     Medications:  Scheduled:  . albuterol  10 mg Nebulization Q4H   And  . ipratropium  0.5 mg Nebulization Q4H  . enoxaparin (LOVENOX) injection  1 mg/kg Subcutaneous Once  . furosemide  40 mg Intravenous Once   Infusions:  . vancomycin     PRN:   Assessment:  32 yo morbidly obese female who presents with worsening SOB.  Recent hospital admission to [facility] on 9/19 for CAP, was treated with IV antibiotics and Levaquin.  She previously had an elevated d-dimer thought to be from PNA.  Unable to obtain CTA Chest at this time secondary to size, starting IV heparin for r/o PE.   No previous clotting history   Adjusted dosing weight for heparin = 156.5 kg  Baseline PT/INR are WNL today  Scr is WNL (0.77) with estimated CrCl > 100 ml/min   CBC WNL  *Note lovenox is not a good option in this patient secondary to size and difficult to monitor and reverse if bleeding were to occur*   Goal of Therapy:  Heparin level 0.3-0.7 units/ml Monitor platelets by anticoagulation protocol: Yes    Plan:  1.) Given patients weight, will use lower end of bolus range and give 7000 units x 1, then start 2000 units/hr (= 20 ml/hr) 2.) Heparin level 6 hours after gtt starts 3.) Monitor renal function  4.) Will need to monitor and adjust heparin very carefully in this obese patient; Monitor for s/sx bleeding 5.) Daily CBC and HL  Nakea Gouger, Loma Messing PharmD Pager #: [phone] 7:20 PM 12/16/2012

## 2012-12-16 NOTE — Progress Notes (Signed)
ANTIBIOTIC CONSULT NOTE - INITIAL  Pharmacy Consult for: Vancomycin/Cefepime  Indication: Sepsis   Allergies  Allergen Reactions  . Fish Allergy Hives and Other (See Comments)    Throat swells  . Other Other (See Comments)    Grass causes a rash  . Shrimp [Shellfish Allergy] Rash    Patient Measurements: Height: 5\' 4"  (162.6 cm) Weight: 799 lb (362.424 kg) IBW/kg (Calculated) : 54.7   Vital Signs: Temp: 100.9 F (38.3 C) (09/23 1644) Temp src: Axillary (09/23 1644) BP: 151/84 mmHg (09/23 1635) Pulse Rate: 124 (09/23 1635) Intake/Output from previous day:   Intake/Output from this shift:    Labs:  Recent Labs  12/16/12 1745  WBC 15.6*  HGB 12.7  PLT 244  CREATININE 0.77   Estimated Creatinine Clearance: 288.6 ml/min (by C-G formula based on Cr of 0.77). No results found for this basename: VANCOTROUGH, Leodis Binet, VANCORANDOM, GENTTROUGH, GENTPEAK, GENTRANDOM, TOBRATROUGH, TOBRAPEAK, TOBRARND, AMIKACINPEAK, AMIKACINTROU, AMIKACIN,  in the last 72 hours   Microbiology: Recent Results (from the past 720 hour(s))  CLOSTRIDIUM DIFFICILE BY PCR     Status: None   Collection Time    12/11/12  1:21 PM      Result Value Range Status   C difficile by pcr NEGATIVE  NEGATIVE Final    Medical History: Past Medical History  Diagnosis Date  . Sleep apnea   . Pneumonia   . Obesity   . Vaginal bleeding   . Pneumonia     Medications:  Scheduled:  . furosemide  40 mg Intravenous Once   Infusions:  . heparin    . heparin    . vancomycin     PRN:  Assessment:  32 yo morbidly obese female who presents with worsening SOB and Sepsis.  Recent hospital admission to [facility] on 9/19 for CAP, was treated with IV antibiotics and discharged on levaquin.  Will start broad spectrum abx with vancomycin and cefepime per pharmacy protocol  Scr is WNL (0.77) with estimated CrCl > 100 ml/min   WBC elevated 15.6  Temp 100.9   Blood and urine cultures sent on 9/23   Goal of  Therapy:  Vancomycin trough level 15-20 mcg/ml Cefepime per renal function   Plan:  1.) Vancomcyin 2500 mg loading dose sent to ER and given 2.) Start vancomycin 2 grams IV q12h 3.) Obtain VT at Css 4.) Daily BMET to monitor SCr on high vancomycin dose in obese patient  5.) Cefepime 2 grams IV Q12h   Akirah Storck, Loma Messing PharmD Pager #: [phone] 7:28 PM 12/16/2012

## 2012-12-17 DIAGNOSIS — I5033 Acute on chronic diastolic (congestive) heart failure: Secondary | ICD-10-CM

## 2012-12-17 DIAGNOSIS — I509 Heart failure, unspecified: Secondary | ICD-10-CM

## 2012-12-17 LAB — CBC
HCT: 41.4 % (ref 36.0–46.0)
MCHC: 30.2 g/dL (ref 30.0–36.0)
Platelets: 233 10*3/uL (ref 150–400)
RDW: 15.2 % (ref 11.5–15.5)
WBC: 16 10*3/uL — ABNORMAL HIGH (ref 4.0–10.5)

## 2012-12-17 LAB — BASIC METABOLIC PANEL
BUN: 8 mg/dL (ref 6–23)
Chloride: 97 mEq/L (ref 96–112)
GFR calc Af Amer: >90 mL/min (ref >90)
GFR calc non Af Amer: >90 mL/min (ref >90)
Potassium: 4.2 mEq/L (ref 3.5–5.1)
Sodium: 137 mEq/L (ref 135–145)

## 2012-12-17 LAB — URINE CULTURE
Colony Count: NO GROWTH
Culture: NO GROWTH

## 2012-12-17 LAB — TROPONIN I
Troponin I: <0.3 ng/mL (ref <0.30)
Troponin I: <0.3 ng/mL (ref <0.30)

## 2012-12-17 LAB — MRSA PCR SCREENING: MRSA by PCR: NEGATIVE

## 2012-12-17 MED ORDER — CHLORHEXIDINE GLUCONATE 0.12 % MT SOLN
15.0000 mL | Freq: Two times a day (BID) | OROMUCOSAL | Status: DC
  Administered 2012-12-17 – 2012-12-20 (×7): 15 mL via OROMUCOSAL
  Filled 2012-12-17 (×6): qty 15

## 2012-12-17 MED ORDER — ENOXAPARIN SODIUM 150 MG/ML ~~LOC~~ SOLN
300.0000 mg | Freq: Two times a day (BID) | SUBCUTANEOUS | Status: DC
  Administered 2012-12-17: 300 mg via SUBCUTANEOUS
  Filled 2012-12-17 (×3): qty 2

## 2012-12-17 MED ORDER — CHLORHEXIDINE GLUCONATE 0.12 % MT SOLN
OROMUCOSAL | Status: AC
  Filled 2012-12-17: qty 15

## 2012-12-17 MED ORDER — VANCOMYCIN HCL 10 G IV SOLR
2000.0000 mg | Freq: Two times a day (BID) | INTRAVENOUS | Status: DC
  Administered 2012-12-17 – 2012-12-19 (×4): 2000 mg via INTRAVENOUS
  Filled 2012-12-17 (×4): qty 2000

## 2012-12-17 MED ORDER — VANCOMYCIN HCL IN DEXTROSE 1-5 GM/200ML-% IV SOLN
1000.0000 mg | INTRAVENOUS | Status: AC
  Administered 2012-12-17: 1000 mg via INTRAVENOUS
  Filled 2012-12-17: qty 200

## 2012-12-17 MED ORDER — BIOTENE DRY MOUTH MT LIQD
15.0000 mL | Freq: Two times a day (BID) | OROMUCOSAL | Status: DC
  Administered 2012-12-17 – 2012-12-19 (×4): 15 mL via OROMUCOSAL

## 2012-12-17 MED ORDER — ENOXAPARIN SODIUM 40 MG/0.4ML ~~LOC~~ SOLN
40.0000 mg | SUBCUTANEOUS | Status: DC
  Filled 2012-12-17: qty 0.4

## 2012-12-17 MED ORDER — ENOXAPARIN SODIUM 150 MG/ML ~~LOC~~ SOLN: 150.0000 mg | SUBCUTANEOUS | Status: DC

## 2012-12-17 MED ORDER — ENOXAPARIN SODIUM 150 MG/ML ~~LOC~~ SOLN
150.0000 mg | SUBCUTANEOUS | Status: DC
  Administered 2012-12-18 – 2012-12-20 (×3): 150 mg via SUBCUTANEOUS
  Filled 2012-12-17 (×3): qty 1

## 2012-12-17 NOTE — ED Provider Notes (Signed)
Medical screening examination/treatment/procedure(s) were conducted as a shared visit with non-physician practitioner(s) and myself.  I personally evaluated the patient during the encounter.  Shon Baton, MD 12/17/12 [phone]

## 2012-12-17 NOTE — Progress Notes (Signed)
Pt's money and ring locked up in hospital safe

## 2012-12-17 NOTE — Progress Notes (Signed)
ANTICOAGULATION CONSULT NOTE - Initial Consult  Pharmacy Consult for enoxaparin Indication: pulmonary embolus  Allergies  Allergen Reactions  . Fish Allergy Hives and Other (See Comments)    Throat swells  . Other Other (See Comments)    Grass causes a rash  . Shrimp [Shellfish Allergy] Rash    Patient Measurements: Height: 5\' 4"  (162.6 cm) Weight: 850 lb 8.6 oz (385.8 kg) IBW/kg (Calculated) : 54.7 Heparin Dosing Weight:   Vital Signs: Temp: 98.3 F (36.8 C) (09/24 0405) Temp src: Axillary (09/24 0405) BP: 142/75 mmHg (09/24 0447) Pulse Rate: 110 (09/24 0447)  Labs:  Recent Labs  12/16/12 1745 12/16/12 2340 12/17/12 0340  HGB 12.7  --  12.5  HCT 42.4  --  41.4  PLT 244  --  233  LABPROT 13.8  --   --   INR 1.08  --   --   CREATININE 0.77  --  0.79  TROPONINI  --  <0.30 <0.30    Estimated Creatinine Clearance: 303.7 ml/min (by C-G formula based on Cr of 0.79).   Medical History: Past Medical History  Diagnosis Date  . Sleep apnea   . Pneumonia   . Obesity   . Vaginal bleeding   . Pneumonia     Medications:  Scheduled:  . albuterol  2.5 mg Nebulization Q6H  . budesonide (PULMICORT) nebulizer solution  0.25 mg Nebulization BID  . ceFEPime (MAXIPIME) IV  2 g Intravenous Q12H  . enoxaparin (LOVENOX) injection  300 mg Subcutaneous BID  . ferrous sulfate  325 mg Oral TID WC  . furosemide  40 mg Intravenous Daily  . pantoprazole  40 mg Oral Daily  . predniSONE  40 mg Oral Q breakfast  . sodium chloride  3 mL Intravenous Q12H  . sodium chloride  3 mL Intravenous Q12H  . vancomycin  1,000 mg Intravenous Q12H  . vitamin A & D       Infusions:    Assessment: Patient already on for heparin for r/o PE.  MD, due to iv line issues, wanted a different medication for PE.  Patient was on lovenox from past admit at [facility].  Level was drawn at [facility] and was at goal range with 300mg  sq q12hr.  Goal of Therapy:  Anti-Xa level 0.6-1.2 units/ml 4hrs after LMWH dose  given Monitor platelets by anticoagulation protocol: Yes   Plan:  Stop heparin and daily heparin level/CBC Begin lovenox 300mg  sq q12hr Will plan to recheck 4hr level around 4th dose or so.   Darlina Guys, Jacquenette Shone Crowford 12/17/2012,5:11 AM

## 2012-12-17 NOTE — Progress Notes (Signed)
[facility] PROGRESS NOTE  Isabel Roberts RUE:[id number] DOB: Sep 14, 1980 DOA: 12/16/2012 PCP: [name], MD  Assessment/Plan: Acute Respiratory Failure -Multifactorial: acute diastolic CHF and HCAP on top of baseline morbid obesity and likely OSA/OHS, as well as h/o asthma. No wheezing on exam today. -Failed attempt to wean NIPPV this am. -Keep in SDU on NIPPV today with continued weaning attempts. -Would benefit from OP sleep study. -We have other plausible explanations for her hypoxia, so will DC therapeutic lovenox dosing. -Start titrating steroids in am.  Acute on Chronic Presumed Diastolic CHF -Recent ECHO with good EF, but unable to evaluate diastolic dysfunction due to body habitus. -Continue lasix and aim for negative fluid balance. -Is 1.6 L negative since admission.  HCAP -Continue vancomycin/cefepime given recent hospitalization. -Await cx data. -Check strep pneumo/legionella urine antigens.  ?Diarrhea -None since admission.  Code Status: Full Code Family Communication: Patient only  Disposition Plan: Keep in ICU today on BiPap.   Consultants:  None   Antibiotics:  Vanc 9/23-->  Cefepime 9/23-->   Subjective: On BiPap. Unable to verbalized. But can nod/shake head to answer questions.  Objective: Filed Vitals:   12/17/12 0751 12/17/12 0800 12/17/12 0840 12/17/12 0900  BP:  116/51    Pulse:  109 110 107  Temp:      TempSrc:      Resp:  41 29 37  Height:      Weight:      SpO2: 97% 97% 85% 93%    Intake/Output Summary (Last 24 hours) at 12/17/12 0937 Last data filed at 12/17/12 0900  Gross per 24 hour  Intake    956 ml  Output   2575 ml  Net  -1619 ml   Filed Weights   12/16/12 1635 12/17/12 0156  Weight: 362.424 kg (799 lb) 385.8 kg (850 lb 8.6 oz)    Exam:   General:  Awake, alert  Cardiovascular: RRR, distant heart sounds  Respiratory: Appears CTA B, altho difficult auscultation given body habitus.  Abdomen: obese,  S/NT/NS/+BS  Extremities: 2+ edema bilaterally.   Neurologic:  Non-focal.  Data Reviewed: Basic Metabolic Panel:  Recent Labs Lab 12/11/12 0500 12/12/12 0415 12/16/12 1745 12/17/12 0340  NA 137 140 139 137  K 3.6 4.2 4.1 4.2  CL 95* 97 98 97  CO2 36* 32 32 35*  GLUCOSE 101* 100* 108* 112*  BUN 17 15 9 8   CREATININE 0.75 0.63 0.77 0.79  CALCIUM 9.0 9.0 9.1 9.0   Liver Function Tests:  Recent Labs Lab 12/16/12 1745  AST 14  ALT 38*  ALKPHOS 128*  BILITOT 0.4  PROT 6.9  ALBUMIN 3.4*   No results found for this basename: LIPASE, AMYLASE,  in the last 168 hours No results found for this basename: AMMONIA,  in the last 168 hours CBC:  Recent Labs Lab 12/10/12 1410 12/16/12 1745 12/17/12 0340  WBC 15.0* 15.6* 16.0*  NEUTROABS  --  12.6*  --   HGB 12.8 12.7 12.5  HCT 40.3 42.4 41.4  MCV 90.0 90.6 92.2  PLT 307 244 233   Cardiac Enzymes:  Recent Labs Lab 12/16/12 2340 12/17/12 0340  TROPONINI <0.30 <0.30   BNP (last 3 results)  Recent Labs  12/08/12 0005 12/16/12 1654  PROBNP 193.8* 41.4   CBG: No results found for this basename: GLUCAP,  in the last 168 hours  Recent Results (from the past 240 hour(s))  CLOSTRIDIUM DIFFICILE BY PCR     Status: None   Collection Time  12/11/12  1:21 PM      Result Value Range Status   C difficile by pcr NEGATIVE  NEGATIVE Final  MRSA PCR SCREENING     Status: None   Collection Time    12/16/12 10:36 PM      Result Value Range Status   MRSA by PCR NEGATIVE  NEGATIVE Final   Comment:            The GeneXpert MRSA Assay (FDA     approved for NASAL specimens     only), is one component of a     comprehensive MRSA colonization     surveillance program. It is not     intended to diagnose MRSA     infection nor to guide or     monitor treatment for     MRSA infections.     Studies: Dg Chest Port 1 View  12/16/2012   CLINICAL DATA:  Shortness of breath  EXAM: PORTABLE CHEST - 1 VIEW  COMPARISON:   December 07, 2012  FINDINGS: There is pulmonary edema. There is consolidation of right lung base, superimposed pneumonia is not excluded. The heart size is enlarged. Mediastinal contour is enlarged. The exam is limited by patient motion.  IMPRESSION: Congestive heart failure. Patchy consolidation of right lung base superimposed pneumonia is not excluded.   Electronically Signed   By: Sherian Rein   On: 12/16/2012 17:51    Scheduled Meds: . albuterol  2.5 mg Nebulization Q6H  . antiseptic oral rinse  15 mL Mouth Rinse q12n4p  . budesonide (PULMICORT) nebulizer solution  0.25 mg Nebulization BID  . ceFEPime (MAXIPIME) IV  2 g Intravenous Q12H  . chlorhexidine  15 mL Mouth Rinse BID  . chlorhexidine      . enoxaparin (LOVENOX) injection  300 mg Subcutaneous BID  . ferrous sulfate  325 mg Oral TID WC  . furosemide  40 mg Intravenous Daily  . pantoprazole  40 mg Oral Daily  . predniSONE  40 mg Oral Q breakfast  . sodium chloride  3 mL Intravenous Q12H  . sodium chloride  3 mL Intravenous Q12H  . vancomycin  1,000 mg Intravenous Q12H  . vitamin A & D       Continuous Infusions:   Principal Problem:   Acute respiratory failure Active Problems:   Morbid obesity   OSA (obstructive sleep apnea)   Asthma   HCAP (healthcare-associated pneumonia)   Acute on chronic diastolic CHF (congestive heart failure)    Time spent: 40 minutes    Isabel Roberts  [facility] Pager [phone]  If 7PM-7AM, please contact night-coverage at [email or web address], password [facility] 12/17/2012, 9:37 AM  LOS: 1 day

## 2012-12-17 NOTE — Progress Notes (Signed)
CARE MANAGEMENT NOTE 12/17/2012  Patient:  Isabel Roberts,Isabel Roberts   Account Number:  [id number]  Date Initiated:  12/17/2012  Documentation initiated by:  DAVIS,RHONDA  Subjective/Objective Assessment:   pt with chf at age 32 and pna failed outpt treatment, placed on bipap with fi02 at 100%     Action/Plan:   home when stable   Anticipated DC Date:  12/20/2012   Anticipated DC Plan:  HOME/SELF CARE  In-house referral  NA      DC Planning Services  NA      [facility] Choice  NA   Choice offered to / List presented to:  NA   DME arranged  NA      DME agency  NA     HH arranged  NA      HH agency  NA   Status of service:  In process, will continue to follow Medicare Important Message given?  NA - LOS <3 / Initial given by admissions (If response is "NO", the following Medicare IM given date fields will be blank) Date Medicare IM given:   Date Additional Medicare IM given:    Discharge Disposition:    Per UR Regulation:  Reviewed for med. necessity/level of care/duration of stay  If discussed at Long Length of Stay Meetings, dates discussed:    Comments:  [id number] Stark Jock, BSN, [place] [phone] Chart Reviewed for discharge and hospital needs. Discharge needs at time of review:  None Review of patient progress due on [id number].

## 2012-12-17 NOTE — Progress Notes (Signed)
Peripherally Inserted Central Catheter/Midline Placement  The IV Nurse has discussed with the patient and/or persons authorized to consent for the patient, the purpose of this procedure and the potential benefits and risks involved with this procedure.  The benefits include less needle sticks, lab draws from the catheter and patient may be discharged home with the catheter.  Risks include, but not limited to, infection, bleeding, blood clot (thrombus formation), and puncture of an artery; nerve damage and irregular heat beat.  Alternatives to this procedure were also discussed.  PICC/Midline Placement Documentation        Isabel Roberts 12/17/2012, 12:31 PM

## 2012-12-17 NOTE — ED Provider Notes (Signed)
Medical screening examination/treatment/procedure(s) were conducted as a shared visit with non-physician practitioner(s) and myself.  I personally evaluated the patient during the encounter  32 yo female with morbid obesity and recently admitted for SOB, PNA and r/o PE who presents in respiratory distress.  Patient currently finishing a course of levaquin.  Patient had an elevated dimer during her admission but was unable to get formal PE testing secondary to body habitus.  Is not currently anticoagulated.  Reports acute onset of SOB today. Was found to be hypoxic and in respiratory distress by EMS.  Patient is also on 40 mg of Lasix daily and reports taking her lasix as prescribed.  INitial exam was notable for tachypnea, tachycardia.  Patient speaking in short sentences.  Patient placed on BiPap for WOB.  Pulmonary exam is limited by body habitus but fair air movement noted bilaterally.  Patient febrile, tachycardic and hypoxic.  Sepsis w/u initiated and broad spectrum antibiotics given.  Patient also given 40 mg IV lasix.  Attempt was made to obtained duplex US but this was inconclusive 2/2 body habitus.  Patient was also empirically anticoagulated for PE.  Patient to be admitted to stepdown on BiPap for further management.  CRITICAL CARE Performed by: Ross Marcus, F   Total critical care time: 40 min  Critical care time was exclusive of separately billable procedures and treating other patients.  Critical care was necessary to treat or prevent imminent or life-threatening deterioration.  Critical care was time spent personally by me on the following activities: development of treatment plan with patient and/or surrogate as well as nursing, discussions with consultants, evaluation of patient's response to treatment, examination of patient, obtaining history from patient or surrogate, ordering and performing treatments and interventions, ordering and review of laboratory studies, ordering and  review of radiographic studies, pulse oximetry and re-evaluation of patient's condition.  EKG independently reviewed by myself:  Sinus tachycardia with a rate of 119, wandering baseline, no evidence of ST elevation of acute ischemia  Shon Baton, MD 12/17/12 1921

## 2012-12-17 NOTE — Progress Notes (Signed)
PICC attempt by Stacie Glaze RN in left Cephalic vein but unable to thread guidewire. Veins are too deep to attempt again.  Staff RN notified

## 2012-12-18 LAB — BASIC METABOLIC PANEL
CO2: 36 mEq/L — ABNORMAL HIGH (ref 19–32)
Calcium: 9.5 mg/dL (ref 8.4–10.5)
GFR calc Af Amer: >90 mL/min (ref >90)
Glucose, Bld: 121 mg/dL — ABNORMAL HIGH (ref 70–99)
Potassium: 4.2 mEq/L (ref 3.5–5.1)
Sodium: 137 mEq/L (ref 135–145)

## 2012-12-18 LAB — CBC
HCT: 42.5 % (ref 36.0–46.0)
MCH: 26.6 pg (ref 26.0–34.0)
MCHC: 28.5 g/dL — ABNORMAL LOW (ref 30.0–36.0)
MCV: 93.4 fL (ref 78.0–100.0)
Platelets: 205 10*3/uL (ref 150–400)
RDW: 14.4 % (ref 11.5–15.5)

## 2012-12-18 MED ORDER — PREDNISONE 20 MG PO TABS
30.0000 mg | ORAL_TABLET | Freq: Every day | ORAL | Status: DC
  Administered 2012-12-19: 10:00:00 30 mg via ORAL
  Filled 2012-12-18 (×2): qty 1

## 2012-12-18 NOTE — Progress Notes (Signed)
ANTIBIOTIC CONSULT NOTE - FOLLOW UP  Pharmacy Consult for Vancomycin Indication: sepsis  Allergies  Allergen Reactions  . Fish Allergy Hives and Other (See Comments)    Throat swells  . Other Other (See Comments)    Grass causes a rash  . Shrimp [Shellfish Allergy] Rash   Vital Signs: Temp: 98.7 F (37.1 C) (09/25 0414) Temp src: Axillary (09/25 0414) BP: 126/75 mmHg (09/25 0414) Pulse Rate: 93 (09/25 0414) Intake/Output from previous day: 09/24 0701 - 09/25 0700 In: 1525 [P.O.:360; I.V.:165; IV Piggyback:1000] Out: 3800 [Urine:3800] Intake/Output from this shift:    Labs:  Recent Labs  12/16/12 1745 12/17/12 0340 12/18/12 0335  WBC 15.6* 16.0* 11.2*  HGB 12.7 12.5 12.1  PLT 244 233 205  CREATININE 0.77 0.79 0.76    Assessment: 32 yo morbidly obese female discharged from [facility] 9/19 for CAP. Patient was treated with Rocephin/Azithromy then changed to Levaquin for 7 days total abx. Patient returned 9/23 with SOB. CXR showed CHF, cannot exclude PNA. Vanc, Cefepime started for HCAP coverage.   PTA >> Levaquin >> 9/23 9/23 >> Cefepime >>  9/23 >> Vanc >>   Tmax: 100.7 WBCs: improved to 11.2K Renal: Scr 0.76, CG/N > 100  9/23 blood x 2 >> NGTD 9/23 urine >> NGF 9/23 C.diff >> ordered (not collected)  No new CXR. WBC improved.   Goal of Therapy:  Vancomycin trough level 15-20 mcg/ml  Plan:   Continue Vancomycin 2gm IV q12h  Continue Cefepime 2g IV q12h  Vancomycin trough at 1900 prior to dose at 2000.  Pharmacy will f/u and adjust dose as needed  Geoffry Paradise, PharmD, BCPS Pager: [phone] 11:00 AM Pharmacy #: [phone]

## 2012-12-18 NOTE — Progress Notes (Signed)
[facility] PROGRESS NOTE  Laila Myhre WJX:[id number] DOB: 04-08-1980 DOA: 12/16/2012 PCP: [name], MD  Assessment/Plan: Acute Respiratory Failure -Multifactorial: acute diastolic CHF and HCAP on top of baseline morbid obesity and likely OSA/OHS, as well as h/o asthma. No wheezing on exam today. -Was off NIPPV for a few hours yesterday. -Keep in SDU on NIPPV today with continued weaning attempts. -Wears CPAP at nighttime. -We have other plausible explanations for her hypoxia, so will DC therapeutic lovenox dosing. -Start titrating steroids.  Acute on Chronic Presumed Diastolic CHF -Recent ECHO with good EF, but unable to evaluate diastolic dysfunction due to body habitus. -Continue lasix and aim for negative fluid balance. -Is 4 L negative since admission.  HCAP -Continue vancomycin/cefepime given recent hospitalization. -Await cx data. -Check strep pneumo/legionella urine antigens.  ?Diarrhea -None since admission.  Code Status: Full Code Family Communication: Patient only  Disposition Plan: Keep in ICU today on BiPap.   Consultants:  None   Antibiotics:  Vanc 9/23-->  Cefepime 9/23-->   Subjective: On BiPap.Is able to answer yes /no questions.  Objective: Filed Vitals:   12/18/12 0027 12/18/12 0414 12/18/12 0500 12/18/12 0729  BP: 133/74 126/75    Pulse: 91 93    Temp:  98.7 F (37.1 C)    TempSrc:  Axillary    Resp:      Height:      Weight:   385 kg (848 lb 12.3 oz)   SpO2: 94% 97%  94%    Intake/Output Summary (Last 24 hours) at 12/18/12 0852 Last data filed at 12/18/12 0603  Gross per 24 hour  Intake   1205 ml  Output   3675 ml  Net  -2470 ml   Filed Weights   12/16/12 1635 12/17/12 0156 12/18/12 0500  Weight: 362.424 kg (799 lb) 385.8 kg (850 lb 8.6 oz) 385 kg (848 lb 12.3 oz)    Exam:   General:  Awake, alert  Cardiovascular: RRR, distant heart sounds  Respiratory: Appears CTA B, altho difficult auscultation given body  habitus.  Abdomen: obese, S/NT/NS/+BS  Extremities: 2+ edema bilaterally.   Neurologic:  Non-focal.  Data Reviewed: Basic Metabolic Panel:  Recent Labs Lab 12/12/12 0415 12/16/12 1745 12/17/12 0340 12/18/12 0335  NA 140 139 137 137  K 4.2 4.1 4.2 4.2  CL 97 98 97 96  CO2 32 32 35* 36*  GLUCOSE 100* 108* 112* 121*  BUN 15 9 8 11   CREATININE 0.63 0.77 0.79 0.76  CALCIUM 9.0 9.1 9.0 9.5   Liver Function Tests:  Recent Labs Lab 12/16/12 1745  AST 14  ALT 38*  ALKPHOS 128*  BILITOT 0.4  PROT 6.9  ALBUMIN 3.4*   No results found for this basename: LIPASE, AMYLASE,  in the last 168 hours No results found for this basename: AMMONIA,  in the last 168 hours CBC:  Recent Labs Lab 12/16/12 1745 12/17/12 0340 12/18/12 0335  WBC 15.6* 16.0* 11.2*  NEUTROABS 12.6*  --   --   HGB 12.7 12.5 12.1  HCT 42.4 41.4 42.5  MCV 90.6 92.2 93.4  PLT 244 233 205   Cardiac Enzymes:  Recent Labs Lab 12/16/12 2340 12/17/12 0340 12/17/12 1015  TROPONINI <0.30 <0.30 <0.30   BNP (last 3 results)  Recent Labs  12/08/12 0005 12/16/12 1654  PROBNP 193.8* 41.4   CBG: No results found for this basename: GLUCAP,  in the last 168 hours  Recent Results (from the past 240 hour(s))  CLOSTRIDIUM DIFFICILE BY PCR  Status: None   Collection Time    12/11/12  1:21 PM      Result Value Range Status   C difficile by pcr NEGATIVE  NEGATIVE Final  CULTURE, BLOOD (ROUTINE X 2)     Status: None   Collection Time    12/16/12  5:45 PM      Result Value Range Status   Specimen Description BLOOD RIGHT ANTECUBITAL   Final   Special Requests NONE BOTTLES DRAWN AEROBIC AND ANAEROBIC 5CC   Final   Culture  Setup Time     Final   Value: 12/17/2012 00:28     Performed at [facility]   Culture     Final   Value:        BLOOD CULTURE RECEIVED NO GROWTH TO DATE CULTURE WILL BE HELD FOR 5 DAYS BEFORE ISSUING A FINAL NEGATIVE REPORT     Performed at [facility]   Report  Status PENDING   Incomplete  CULTURE, BLOOD (ROUTINE X 2)     Status: None   Collection Time    12/16/12  5:50 PM      Result Value Range Status   Specimen Description BLOOD RT FA   Final   Special Requests NONE BOTTLES DRAWN AEROBIC AND ANAEROBIC 5CC   Final   Culture  Setup Time     Final   Value: 12/17/2012 00:28     Performed at [facility]   Culture     Final   Value:        BLOOD CULTURE RECEIVED NO GROWTH TO DATE CULTURE WILL BE HELD FOR 5 DAYS BEFORE ISSUING A FINAL NEGATIVE REPORT     Performed at [facility]   Report Status PENDING   Incomplete  URINE CULTURE     Status: None   Collection Time    12/16/12  8:42 PM      Result Value Range Status   Specimen Description URINE, CATHETERIZED   Final   Special Requests NONE   Final   Culture  Setup Time     Final   Value: 12/17/2012 01:28     Performed at [facility] Count     Final   Value: NO GROWTH     Performed at [facility]   Culture     Final   Value: NO GROWTH     Performed at [facility]   Report Status 12/17/2012 FINAL   Final  MRSA PCR SCREENING     Status: None   Collection Time    12/16/12 10:36 PM      Result Value Range Status   MRSA by PCR NEGATIVE  NEGATIVE Final   Comment:            The GeneXpert MRSA Assay (FDA     approved for NASAL specimens     only), is one component of a     comprehensive MRSA colonization     surveillance program. It is not     intended to diagnose MRSA     infection nor to guide or     monitor treatment for     MRSA infections.     Studies: Dg Chest Port 1 View  12/16/2012   CLINICAL DATA:  Shortness of breath  EXAM: PORTABLE CHEST - 1 VIEW  COMPARISON:  December 07, 2012  FINDINGS: There is pulmonary edema. There is consolidation of right lung base, superimposed pneumonia is  not excluded. The heart size is enlarged. Mediastinal contour is enlarged. The exam is limited by patient motion.  IMPRESSION: Congestive heart  failure. Patchy consolidation of right lung base superimposed pneumonia is not excluded.   Electronically Signed   By: Sherian Rein   On: 12/16/2012 17:51    Scheduled Meds: . albuterol  2.5 mg Nebulization Q6H  . antiseptic oral rinse  15 mL Mouth Rinse q12n4p  . budesonide (PULMICORT) nebulizer solution  0.25 mg Nebulization BID  . ceFEPime (MAXIPIME) IV  2 g Intravenous Q12H  . chlorhexidine  15 mL Mouth Rinse BID  . enoxaparin (LOVENOX) injection  150 mg Subcutaneous Q24H  . ferrous sulfate  325 mg Oral TID WC  . furosemide  40 mg Intravenous Daily  . pantoprazole  40 mg Oral Daily  . predniSONE  40 mg Oral Q breakfast  . sodium chloride  3 mL Intravenous Q12H  . sodium chloride  3 mL Intravenous Q12H  . vancomycin  2,000 mg Intravenous Q12H   Continuous Infusions:   Principal Problem:   Acute respiratory failure Active Problems:   Morbid obesity   OSA (obstructive sleep apnea)   Asthma   HCAP (healthcare-associated pneumonia)   Acute on chronic diastolic CHF (congestive heart failure)    Time spent: 35 minutes    [name],[name]  [facility] Pager [phone]  If 7PM-7AM, please contact night-coverage at [email or web address], password [facility] 12/18/2012, 8:52 AM  LOS: 2 days

## 2012-12-18 NOTE — Plan of Care (Signed)
Problem: Phase I Progression Outcomes Goal: Progress activity as tolerated unless otherwise ordered Outcome: Progressing OOB to BSC with walker and o2, SOB with exertion

## 2012-12-18 NOTE — Progress Notes (Signed)
ANTIBIOTIC CONSULT NOTE - FOLLOW UP  Pharmacy Consult for Vancomycin Indication: sepsis  Allergies  Allergen Reactions  . Fish Allergy Hives and Other (See Comments)    Throat swells  . Other Other (See Comments)    Grass causes a rash  . Shrimp [Shellfish Allergy] Rash    Patient Measurements: Height: 5\' 4"  (162.6 cm) Weight: 735 lb 10.8 oz (333.7 kg) IBW/kg (Calculated) : 54.7   Vital Signs: Temp: 98.9 F (37.2 C) (09/25 1600) Temp src: Oral (09/25 1600) BP: 111/65 mmHg (09/25 1915) Pulse Rate: 86 (09/25 1915) Intake/Output from previous day: 09/24 0701 - 09/25 0700 In: 1545 [P.O.:360; I.V.:185; IV Piggyback:1000] Out: 3800 [Urine:3800] Intake/Output from this shift:    Labs:  Recent Labs  12/16/12 1745 12/17/12 0340 12/18/12 0335  WBC 15.6* 16.0* 11.2*  HGB 12.7 12.5 12.1  PLT 244 233 205  CREATININE 0.77 0.79 0.76   Estimated Creatinine Clearance: 269.9 ml/min (by C-G formula based on Cr of 0.76).  Recent Labs  12/18/12 1905  VANCOTROUGH 11.7     Microbiology: Recent Results (from the past 720 hour(s))  CLOSTRIDIUM DIFFICILE BY PCR     Status: None   Collection Time    12/11/12  1:21 PM      Result Value Range Status   C difficile by pcr NEGATIVE  NEGATIVE Final  CULTURE, BLOOD (ROUTINE X 2)     Status: None   Collection Time    12/16/12  5:45 PM      Result Value Range Status   Specimen Description BLOOD RIGHT ANTECUBITAL   Final   Special Requests NONE BOTTLES DRAWN AEROBIC AND ANAEROBIC 5CC   Final   Culture  Setup Time     Final   Value: 12/17/2012 00:28     Performed at [facility]   Culture     Final   Value:        BLOOD CULTURE RECEIVED NO GROWTH TO DATE CULTURE WILL BE HELD FOR 5 DAYS BEFORE ISSUING A FINAL NEGATIVE REPORT     Performed at [facility]   Report Status PENDING   Incomplete  CULTURE, BLOOD (ROUTINE X 2)     Status: None   Collection Time    12/16/12  5:50 PM      Result Value Range Status   Specimen Description BLOOD RT FA   Final   Special Requests NONE BOTTLES DRAWN AEROBIC AND ANAEROBIC 5CC   Final   Culture  Setup Time     Final   Value: 12/17/2012 00:28     Performed at [facility]   Culture     Final   Value:        BLOOD CULTURE RECEIVED NO GROWTH TO DATE CULTURE WILL BE HELD FOR 5 DAYS BEFORE ISSUING A FINAL NEGATIVE REPORT     Performed at [facility]   Report Status PENDING   Incomplete  URINE CULTURE     Status: None   Collection Time    12/16/12  8:42 PM      Result Value Range Status   Specimen Description URINE, CATHETERIZED   Final   Special Requests NONE   Final   Culture  Setup Time     Final   Value: 12/17/2012 01:28     Performed at [facility] Count     Final   Value: NO GROWTH     Performed at [facility]  Final   Value: NO GROWTH     Performed at [facility]   Report Status 12/17/2012 FINAL   Final  MRSA PCR SCREENING     Status: None   Collection Time    12/16/12 10:36 PM      Result Value Range Status   MRSA by PCR NEGATIVE  NEGATIVE Final   Comment:            The GeneXpert MRSA Assay (FDA     approved for NASAL specimens     only), is one component of a     comprehensive MRSA colonization     surveillance program. It is not     intended to diagnose MRSA     infection nor to guide or     monitor treatment for     MRSA infections.    Anti-infectives   Start     Dose/Rate Route Frequency Ordered Stop   12/17/12 2000  vancomycin (VANCOCIN) 2,000 mg in sodium chloride 0.9 % 500 mL IVPB     2,000 mg 250 mL/hr over 120 Minutes Intravenous Every 12 hours 12/17/12 1041     12/17/12 1045  vancomycin (VANCOCIN) IVPB 1000 mg/200 mL premix     1,000 mg 200 mL/hr over 60 Minutes Intravenous NOW 12/17/12 1041 12/17/12 1158   12/17/12 0800  vancomycin (VANCOCIN) IVPB 1000 mg/200 mL premix  Status:  Discontinued     1,000 mg 200 mL/hr over 60 Minutes Intravenous Every 12 hours  12/16/12 1948 12/17/12 1041   12/17/12 0600  ceFEPIme (MAXIPIME) 2 g in dextrose 5 % 50 mL IVPB     2 g 100 mL/hr over 30 Minutes Intravenous Every 12 hours 12/16/12 1948     12/16/12 1715  vancomycin (VANCOCIN) 2,500 mg in sodium chloride 0.9 % 500 mL IVPB     2,500 mg 250 mL/hr over 120 Minutes Intravenous  Once 12/16/12 1703 12/16/12 2154   12/16/12 1700  ceFEPIme (MAXIPIME) 2 g in dextrose 5 % 50 mL IVPB     2 g 100 mL/hr over 30 Minutes Intravenous  Once 12/16/12 1653 12/16/12 1841   12/16/12 1700  vancomycin (VANCOCIN) IVPB 1000 mg/200 mL premix  Status:  Discontinued     1,000 mg 200 mL/hr over 60 Minutes Intravenous  Once 12/16/12 1653 12/16/12 1703      Assessment: 32 yo morbidly obese female discharged from [facility] 9/19 for CAP. Patient was treated with Rocephin/Azithromy then changed to Levaquin for 7 days total abx. Patient returned 9/23 with SOB. CXR showed CHF, cannot exclude PNA. Vanc, Cefepime started for HCAP coverage.   PTA >> Levaquin >> 9/23 9/23 >> Cefepime >>  9/23 >> Vanc >>   Tmax: 100.7 WBCs: improved to 11.2K Renal: Scr 0.76, CG/N > 100  9/23 blood x 2 >> NGTD 9/23 urine >> NGF 9/23 C.diff >> ordered (not collected)  Vanc trough = 11.7 on 9/25 1905  Goal of Therapy:  Vancomycin trough level 15-20 mcg/ml  Plan:  Continue Vancomycin 2gm IV q12h as patient will likely accumulate vancomycin. Continue to monitor renal function and check cultures   Loletta Specter 12/18/2012,8:06 PM

## 2012-12-19 DIAGNOSIS — R609 Edema, unspecified: Secondary | ICD-10-CM

## 2012-12-19 LAB — CBC
HCT: 42.1 % (ref 36.0–46.0)
MCH: 26.9 pg (ref 26.0–34.0)
MCHC: 29.2 g/dL — ABNORMAL LOW (ref 30.0–36.0)
RDW: 14.4 % (ref 11.5–15.5)
WBC: 9.7 10*3/uL (ref 4.0–10.5)

## 2012-12-19 LAB — BASIC METABOLIC PANEL
BUN: 11 mg/dL (ref 6–23)
Calcium: 9.4 mg/dL (ref 8.4–10.5)
Chloride: 94 mEq/L — ABNORMAL LOW (ref 96–112)
Creatinine, Ser: 0.66 mg/dL (ref 0.50–1.10)
GFR calc Af Amer: >90 mL/min (ref >90)
GFR calc non Af Amer: >90 mL/min (ref >90)
Glucose, Bld: 109 mg/dL — ABNORMAL HIGH (ref 70–99)

## 2012-12-19 MED ORDER — PREDNISONE 20 MG PO TABS
20.0000 mg | ORAL_TABLET | Freq: Every day | ORAL | Status: DC
  Administered 2012-12-20: 20 mg via ORAL
  Filled 2012-12-19 (×2): qty 1

## 2012-12-19 MED ORDER — LEVOFLOXACIN 750 MG PO TABS
750.0000 mg | ORAL_TABLET | Freq: Every day | ORAL | Status: DC
  Administered 2012-12-19 – 2012-12-20 (×2): 750 mg via ORAL
  Filled 2012-12-19 (×2): qty 1

## 2012-12-19 MED ORDER — CEPASTAT 14.5 MG MT LOZG
1.0000 | LOZENGE | OROMUCOSAL | Status: DC | PRN
  Filled 2012-12-19 (×2): qty 18

## 2012-12-19 MED ORDER — LIVING BETTER WITH HEART FAILURE BOOK
Freq: Once | Status: AC
  Administered 2012-12-19: 1
  Filled 2012-12-19: qty 1

## 2012-12-19 NOTE — Plan of Care (Signed)
Problem: Food- and Nutrition-Related Knowledge Deficit (NB-1.1) Goal: Nutrition education Formal process to instruct or train a patient/client in a skill or to impart knowledge to help patients/clients voluntarily manage or modify food choices and eating behavior to maintain or improve health. Outcome: Completed/Met Date Met:  12/19/12  Nutrition Education Note  RD consulted for nutrition education regarding CHF.  RD provided "Low Sodium Nutrition Therapy" handout from the Academy of [employer]. Reviewed patient's dietary recall. Provided examples on ways to decrease sodium intake in diet. Discouraged intake of processed foods and use of salt shaker. Encouraged fresh fruits and vegetables as well as whole grain sources of carbohydrates to maximize fiber intake.   RD discussed why it is important for patient to adhere to diet recommendations, and emphasized the role of fluids, foods to avoid, and importance of weighing self daily. Teach back method used.  Expect poor compliance.  Body mass index is 122.51 kg/(m^2). Pt meets criteria for Obese Class III based on current BMI.  Current diet order is Heart Healthy, patient is consuming approximately 75-100% of meals at this time. Labs and medications reviewed. No further nutrition interventions warranted at this time. RD contact information provided. If additional nutrition issues arise, please re-consult RD.   Jarold Motto MS, RD, LDN Pager: [phone] After-hours pager: [phone]

## 2012-12-19 NOTE — Progress Notes (Signed)
[facility] PROGRESS NOTE  Isabel Roberts YQM:[id number] DOB: Jan 25, 1981 DOA: 12/16/2012 PCP: [name], MD  Assessment/Plan: Acute Respiratory Failure -Multifactorial: acute diastolic CHF and HCAP on top of baseline morbid obesity and likely OSA/OHS, as well as h/o asthma. No wheezing on exam today. -Was off NIPPV for a few hours yesterday. -Keep in SDU on NIPPV today with continued weaning attempts. -Wears CPAP at nighttime. -We have other plausible explanations for her hypoxia, so will DC therapeutic lovenox dosing. -Start titrating steroids.  Acute on Chronic Presumed Diastolic CHF -Recent ECHO with good EF, but unable to evaluate diastolic dysfunction due to body habitus. -Continue lasix and aim for negative fluid balance. -Is 4.3L negative since admission.  HCAP -Cx data remains negative to date. -Will transition abx to levaquin PO.  ?Diarrhea -None since admission.  Code Status: Full Code Family Communication: Patient only  Disposition Plan: Keep in ICU today on BiPap.   Consultants:  None   Antibiotics:  Vanc 9/23-->9/26  Cefepime 9/23--> 9/26  Levaquin 9/26-->  Subjective: States she doesn't think her CPAP machine at home is optimally titrated cause it feels much better here. Has many questions regarding her diagnoses. No true complaint.  Objective: Filed Vitals:   12/19/12 0102 12/19/12 0114 12/19/12 0432 12/19/12 0942  BP:  135/64    Pulse:  85    Temp:   98.6 F (37 C) 98.1 F (36.7 C)  TempSrc:   Axillary Axillary  Resp:  17    Height:      Weight:   323.9 kg (714 lb 1.1 oz)   SpO2: 95% 95%      Intake/Output Summary (Last 24 hours) at 12/19/12 0950 Last data filed at 12/19/12 0600  Gross per 24 hour  Intake   1320 ml  Output   1150 ml  Net    170 ml   Filed Weights   12/18/12 0500 12/18/12 1541 12/19/12 0432  Weight: 385 kg (848 lb 12.3 oz) 333.7 kg (735 lb 10.8 oz) 323.9 kg (714 lb 1.1 oz)    Exam:   General:  Awake,  alert  Cardiovascular: RRR, distant heart sounds  Respiratory: Appears CTA B, altho difficult auscultation given body habitus.  Abdomen: obese, S/NT/NS/+BS  Extremities: 2+ edema bilaterally.   Neurologic:  Non-focal.  Data Reviewed: Basic Metabolic Panel:  Recent Labs Lab 12/16/12 1745 12/17/12 0340 12/18/12 0335 12/19/12 0330  NA 139 137 137 137  K 4.1 4.2 4.2 4.0  CL 98 97 96 94*  CO2 32 35* 36* 37*  GLUCOSE 108* 112* 121* 109*  BUN 9 8 11 11   CREATININE 0.77 0.79 0.76 0.66  CALCIUM 9.1 9.0 9.5 9.4   Liver Function Tests:  Recent Labs Lab 12/16/12 1745  AST 14  ALT 38*  ALKPHOS 128*  BILITOT 0.4  PROT 6.9  ALBUMIN 3.4*   No results found for this basename: LIPASE, AMYLASE,  in the last 168 hours No results found for this basename: AMMONIA,  in the last 168 hours CBC:  Recent Labs Lab 12/16/12 1745 12/17/12 0340 12/18/12 0335 12/19/12 0330  WBC 15.6* 16.0* 11.2* 9.7  NEUTROABS 12.6*  --   --   --   HGB 12.7 12.5 12.1 12.3  HCT 42.4 41.4 42.5 42.1  MCV 90.6 92.2 93.4 91.9  PLT 244 233 205 249   Cardiac Enzymes:  Recent Labs Lab 12/16/12 2340 12/17/12 0340 12/17/12 1015  TROPONINI <0.30 <0.30 <0.30   BNP (last 3 results)  Recent Labs  12/08/12 0005 12/16/12 1654  PROBNP 193.8* 41.4   CBG: No results found for this basename: GLUCAP,  in the last 168 hours  Recent Results (from the past 240 hour(s))  CLOSTRIDIUM DIFFICILE BY PCR     Status: None   Collection Time    12/11/12  1:21 PM      Result Value Range Status   C difficile by pcr NEGATIVE  NEGATIVE Final  CULTURE, BLOOD (ROUTINE X 2)     Status: None   Collection Time    12/16/12  5:45 PM      Result Value Range Status   Specimen Description BLOOD RIGHT ANTECUBITAL   Final   Special Requests NONE BOTTLES DRAWN AEROBIC AND ANAEROBIC 5CC   Final   Culture  Setup Time     Final   Value: 12/17/2012 00:28     Performed at [facility]   Culture     Final   Value:         BLOOD CULTURE RECEIVED NO GROWTH TO DATE CULTURE WILL BE HELD FOR 5 DAYS BEFORE ISSUING A FINAL NEGATIVE REPORT     Performed at [facility]   Report Status PENDING   Incomplete  CULTURE, BLOOD (ROUTINE X 2)     Status: None   Collection Time    12/16/12  5:50 PM      Result Value Range Status   Specimen Description BLOOD RT FA   Final   Special Requests NONE BOTTLES DRAWN AEROBIC AND ANAEROBIC 5CC   Final   Culture  Setup Time     Final   Value: 12/17/2012 00:28     Performed at [facility]   Culture     Final   Value:        BLOOD CULTURE RECEIVED NO GROWTH TO DATE CULTURE WILL BE HELD FOR 5 DAYS BEFORE ISSUING A FINAL NEGATIVE REPORT     Performed at [facility]   Report Status PENDING   Incomplete  URINE CULTURE     Status: None   Collection Time    12/16/12  8:42 PM      Result Value Range Status   Specimen Description URINE, CATHETERIZED   Final   Special Requests NONE   Final   Culture  Setup Time     Final   Value: 12/17/2012 01:28     Performed at [facility] Count     Final   Value: NO GROWTH     Performed at [facility]   Culture     Final   Value: NO GROWTH     Performed at [facility]   Report Status 12/17/2012 FINAL   Final  MRSA PCR SCREENING     Status: None   Collection Time    12/16/12 10:36 PM      Result Value Range Status   MRSA by PCR NEGATIVE  NEGATIVE Final   Comment:            The GeneXpert MRSA Assay (FDA     approved for NASAL specimens     only), is one component of a     comprehensive MRSA colonization     surveillance program. It is not     intended to diagnose MRSA     infection nor to guide or     monitor treatment for     MRSA infections.     Studies: No results found.  Scheduled Meds: . albuterol  2.5 mg Nebulization Q6H  . antiseptic oral rinse  15 mL Mouth Rinse q12n4p  . budesonide (PULMICORT) nebulizer solution  0.25 mg Nebulization BID  . chlorhexidine  15  mL Mouth Rinse BID  . enoxaparin (LOVENOX) injection  150 mg Subcutaneous Q24H  . ferrous sulfate  325 mg Oral TID WC  . furosemide  40 mg Intravenous Daily  . levofloxacin  750 mg Oral Daily  . pantoprazole  40 mg Oral Daily  . [START ON 12/20/2012] predniSONE  20 mg Oral Q breakfast  . sodium chloride  3 mL Intravenous Q12H  . sodium chloride  3 mL Intravenous Q12H   Continuous Infusions:   Principal Problem:   Acute respiratory failure Active Problems:   Morbid obesity   OSA (obstructive sleep apnea)   Asthma   HCAP (healthcare-associated pneumonia)   Acute on chronic diastolic CHF (congestive heart failure)    Time spent: 35 minutes    [name],[name]  [facility] Pager [phone]  If 7PM-7AM, please contact night-coverage at [email or web address], password [facility] 12/19/2012, 9:50 AM  LOS: 3 days

## 2012-12-20 LAB — CBC
MCH: 26.8 pg (ref 26.0–34.0)
Platelets: 260 10*3/uL (ref 150–400)
RBC: 4.59 MIL/uL (ref 3.87–5.11)
RDW: 14.5 % (ref 11.5–15.5)
WBC: 10.1 10*3/uL (ref 4.0–10.5)

## 2012-12-20 LAB — BASIC METABOLIC PANEL
CO2: 39 mEq/L — ABNORMAL HIGH (ref 19–32)
Calcium: 9.5 mg/dL (ref 8.4–10.5)
Creatinine, Ser: 0.75 mg/dL (ref 0.50–1.10)
GFR calc Af Amer: >90 mL/min (ref >90)
Potassium: 3.9 mEq/L (ref 3.5–5.1)
Sodium: 138 mEq/L (ref 135–145)

## 2012-12-20 MED ORDER — LEVOFLOXACIN 750 MG PO TABS: 750.0000 mg | ORAL_TABLET | Freq: Every day | ORAL | Status: DC

## 2012-12-20 MED ORDER — PREDNISONE 10 MG PO TABS: 10.0000 mg | ORAL_TABLET | Freq: Every day | ORAL | Status: DC

## 2012-12-20 NOTE — Care Management Note (Signed)
    Page 1 of 2   12/20/2012     5:10:04 PM   CARE MANAGEMENT NOTE 12/20/2012  Patient:  Isabel Roberts,Isabel Roberts   Account Number:  [id number]  Date Initiated:  12/17/2012  Documentation initiated by:  DAVIS,RHONDA  Subjective/Objective Assessment:   pt with chf at age 32 and pna failed outpt treatment, placed on bipap with fi02 at 100%     Action/Plan:   home when stable   Anticipated DC Date:  12/20/2012   Anticipated DC Plan:  HOME/SELF CARE  In-house referral  NA      DC Planning Services  CM consult      Choice offered to / List presented to:  NA   DME arranged  OXYGEN      DME agency  OTHER - SEE NOTE     HH arranged  NA      HH agency  NA   Status of service:  Completed, signed off Medicare Important Message given?  NA - LOS <3 / Initial given by admissions (If response is "NO", the following Medicare IM given date fields will be blank) Date Medicare IM given:   Date Additional Medicare IM given:    Discharge Disposition:  HOME/SELF CARE  Per UR Regulation:  Reviewed for med. necessity/level of care/duration of stay  If discussed at Long Length of Stay Meetings, dates discussed:    Comments:  12/20/12 Curlie Sittner RN,BSN NCM [id number] PATIENT USES [facility] FOR HOME 02 TEL#[phone]/FAX#[phone] [id number].SPOKE TO KENNETH HUBBARD(ON CALL)CONFIRMED [facility] FOR HOME 02,& CPAP.INFORMED OF PATIENT 02 SATS 50%RA @ REST,& PAITNET CONCERNS ABOUT HER HOME 02NOT WORKING PROPERLY, & CPAP NOT WORKING PROPERLY.KENNETH SPOKE DIRECTLY TO PATIENT ON PHONE & WILL HAVE A TECH COME TOHER HOUSE TO CHECK CPAP,HE INFORMED PATIENT THAT SINCE SHE HAS A CONCENTRATOR THAT PROVIDES 24HR 02-CAN USE FOR NEW ORDER,& W/HOME 02 NEW ORDER FAXED W/CONFIRMATION THEY WILL DELIVER PORTABLES TO HOME,& N/C.ALSO THE CPAP WILL BE CHECK SINCE ON HOME 02 ORDER FAXED MD STATED TO CHECK CPAP @ HOME.ORDERS FAXED:HOME 02 ORDER,SATS,D/C SUMMARY.PATIENT LEAVING VIA AMBULANCE TRANSP.NO FURTHER D/C  NEEDS.  [id number] Earlene Plater, RN, BSN, [place] [phone] Chart Reviewed for discharge and hospital needs. Discharge needs at time of review:  None Review of patient progress due on [id number].

## 2012-12-20 NOTE — Progress Notes (Signed)
Pt in no distress at this time. BiPAP not needed.

## 2012-12-20 NOTE — Progress Notes (Signed)
Entered room to complete DC info. Pt reports feeling "boggy" headed. Checked O2 saturation on room air- 50%. Changed pulse Ox sites and O2 saturation remain 50%. 4 L [place] applied. O2 increased to 90%- pt feeling "much more clear headed". Md paged and order to monitor pt on 4 L [place] for the next hour and case management order for home oxygen to be placed.

## 2012-12-20 NOTE — Progress Notes (Signed)
PT does not appear to need BiPAP at this time- RN aware.

## 2012-12-20 NOTE — Progress Notes (Signed)
PT has not needed BiPAP- post RN removal this morning. Currently PT off- previously on RA with Sp02 of 95%- RN aware.

## 2012-12-20 NOTE — Discharge Summary (Signed)
Physician Discharge Summary  Isabel Roberts ZOX:[id number] DOB: Oct 28, 1980 DOA: 12/16/2012  PCP: [name], MD  Admit date: 12/16/2012 Discharge date: 12/20/2012  Time spent: 45 minutes  Recommendations for Outpatient Follow-up:  -Advised to follow up with PCP in 2 weeks. -Will need to complete 5 more days of Levaquin and a prednisone taper. -Will arrange for OP sleep study and for [facility] services to check CPAP at home to make sure in functioning order.   Discharge Diagnoses:  Principal Problem:   Acute respiratory failure Active Problems:   Morbid obesity   OSA (obstructive sleep apnea)   Asthma   HCAP (healthcare-associated pneumonia)   Acute on chronic diastolic CHF (congestive heart failure)   Discharge Condition: Stable and improved  Filed Weights   12/18/12 1541 12/19/12 0432 12/20/12 0400  Weight: 333.7 kg (735 lb 10.8 oz) 323.9 kg (714 lb 1.1 oz) 339.8 kg (749 lb 2 oz)    History of present illness:  Isabel Roberts is a 32 y.o. female with known history of obstructive sleep apnea/obesity hypoventilation syndrome on BiPAP at home, asthma and morbid obesity who was recently discharged from hospital after being treated for pneumonia presents with complaints of persistent shortness of breath since last evening. Patient stated that she was short of breath at rest and on exertion. Denies any chest pain. Shortness of breath was associated with productive cough and wheezing. In the ER patient was found to be hypoxic and was placed on BiPAP initially on 100% presently FiO2 is 70% and patient is able to talk comfortably. Patient states her shortness of breath has improved since coming to the ER but still feels congested. Chest x-ray shows congestion and possible pneumonia. During the last admission patient had Dopplers done of the lower extremities which was nonconclusive and at that time patient d-dimer was found to be elevated. 2-D echo showed EF of 60% and patient was diuresed with IV Lasix  and discharged home on Lasix 40 daily which patient states she has been taking. Patient will be admitted for further management. Patient has been started on empiric antibiotics for health care associated pneumonia and a dose of IV Lasix has been given. Patient also has been started on IV heparin completely for possible DVT/PE given that patient cannot have CT angiogram or other studies due to the weight limitation. Patient has been having diarrhea since the last discharge. Denies any nausea vomiting or abdominal pain. We were asked to admit her for further evaluation and management.   Hospital Course:   Acute on Chronic Respiratory Failure  -Multifactorial: acute diastolic CHF and HCAP on top of baseline morbid obesity and likely OSA/OHS, as well as h/o asthma.  -Has been off BIPAP for a full 24 hours, except for nighttime. -Wears CPAP at nighttime.   Acute on Chronic Presumed Diastolic CHF  -Recent ECHO with good EF, but unable to evaluate diastolic dysfunction due to body habitus.  -Continue lasix and aim for negative fluid balance.  -Is 5.7L negative since admission.   HCAP  -Cx data remains negative to date.  -Will transition abx to levaquin PO.  -Has to complete 5 more days on DC.  ?Diarrhea  -None since admission.   Procedures:  none   Consultations:  None   Discharge Instructions  Discharge Orders   Future Orders Complete By Expires   Diet - low sodium heart healthy  As directed    Discontinue IV  As directed    Foley catheter - discontinue  As directed  Increase activity slowly  As directed        Medication List    STOP taking these medications       PRESCRIPTION MEDICATION      TAKE these medications       albuterol 108 (90 BASE) MCG/ACT inhaler  Commonly known as:  PROVENTIL HFA;VENTOLIN HFA  Inhale 2 puffs into the lungs daily as needed for wheezing or shortness of breath.     ferrous sulfate 325 (65 FE) MG tablet  Take 325 mg by mouth 3 (three)  times daily with meals.     furosemide 40 MG tablet  Commonly known as:  LASIX  Take 1 tablet (40 mg total) by mouth daily.     levofloxacin 750 MG tablet  Commonly known as:  LEVAQUIN  Take 1 tablet (750 mg total) by mouth daily.     naproxen sodium 220 MG tablet  Commonly known as:  ANAPROX  Take 220 mg by mouth daily as needed (pain).     norethindrone 5 MG tablet  Commonly known as:  AYGESTIN  Take 5 mg by mouth daily.     predniSONE 10 MG tablet  Commonly known as:  DELTASONE  Take 1 tablet (10 mg total) by mouth daily. Take 20 mg on 9/28 and 10 mg on 9/29 then discontinue.     traMADol 50 MG tablet  Commonly known as:  ULTRAM  Take 150 mg by mouth 2 (two) times daily as needed for pain.     VITAMIN E PO  Take 1 capsule by mouth daily.       Allergies  Allergen Reactions  . Fish Allergy Hives and Other (See Comments)    Throat swells  . Other Other (See Comments)    Grass causes a rash  . Shrimp [Shellfish Allergy] Rash       Follow-up Information   Follow up with [name], MD. Schedule an appointment as soon as possible for a visit in 2 weeks.   Specialty:  Family Medicine   Contact information:   [address] [place] [address] [phone]        The results of significant diagnostics from this hospitalization (including imaging, microbiology, ancillary and laboratory) are listed below for reference.    Significant Diagnostic Studies: Dg Chest Port 1 View  12/16/2012   CLINICAL DATA:  Shortness of breath  EXAM: PORTABLE CHEST - 1 VIEW  COMPARISON:  December 07, 2012  FINDINGS: There is pulmonary edema. There is consolidation of right lung base, superimposed pneumonia is not excluded. The heart size is enlarged. Mediastinal contour is enlarged. The exam is limited by patient motion.  IMPRESSION: Congestive heart failure. Patchy consolidation of right lung base superimposed pneumonia is not excluded.   Electronically Signed   By:  Sherian Rein   On: 12/16/2012 17:51   Dg Chest Port 1 View  12/07/2012   CLINICAL DATA:  Shortness of breath.  EXAM: PORTABLE CHEST - 1 VIEW  COMPARISON:  No priors.  FINDINGS: Film is underpenetrated, limiting the diagnostic sensitivity and specificity the examination. With these limitations in mind, there do appear to be patchy interstitial and multifocal airspace opacities throughout the lungs, most confluent in the right mid to lower lung and at the left base, concerning for potential multilobar pneumonia. Sequela of aspiration could have a similar appearance. No definite scratch at no definite pleural effusions. No evidence of pulmonary edema. Heart size appears mildly enlarged. The patient is rotated  to the left on today's exam, resulting in distortion of the mediastinal contours and reduced diagnostic sensitivity and specificity for mediastinal pathology.  IMPRESSION: 1. Multifocal patchy interstitial and airspace disease in the lung lungs, as above, concerning for multilobar pneumonia or sequela of aspiration. 2. Mild cardiomegaly.   Electronically Signed   By: Trudie Reed M.D.   On: 12/07/2012 22:48    Microbiology: Recent Results (from the past 240 hour(s))  CLOSTRIDIUM DIFFICILE BY PCR     Status: None   Collection Time    12/11/12  1:21 PM      Result Value Range Status   C difficile by pcr NEGATIVE  NEGATIVE Final  CULTURE, BLOOD (ROUTINE X 2)     Status: None   Collection Time    12/16/12  5:45 PM      Result Value Range Status   Specimen Description BLOOD RIGHT ANTECUBITAL   Final   Special Requests NONE BOTTLES DRAWN AEROBIC AND ANAEROBIC 5CC   Final   Culture  Setup Time     Final   Value: 12/17/2012 00:28     Performed at [facility]   Culture     Final   Value:        BLOOD CULTURE RECEIVED NO GROWTH TO DATE CULTURE WILL BE HELD FOR 5 DAYS BEFORE ISSUING A FINAL NEGATIVE REPORT     Performed at [facility]   Report Status PENDING   Incomplete   CULTURE, BLOOD (ROUTINE X 2)     Status: None   Collection Time    12/16/12  5:50 PM      Result Value Range Status   Specimen Description BLOOD RT FA   Final   Special Requests NONE BOTTLES DRAWN AEROBIC AND ANAEROBIC 5CC   Final   Culture  Setup Time     Final   Value: 12/17/2012 00:28     Performed at [facility]   Culture     Final   Value:        BLOOD CULTURE RECEIVED NO GROWTH TO DATE CULTURE WILL BE HELD FOR 5 DAYS BEFORE ISSUING A FINAL NEGATIVE REPORT     Performed at [facility]   Report Status PENDING   Incomplete  URINE CULTURE     Status: None   Collection Time    12/16/12  8:42 PM      Result Value Range Status   Specimen Description URINE, CATHETERIZED   Final   Special Requests NONE   Final   Culture  Setup Time     Final   Value: 12/17/2012 01:28     Performed at [facility] Count     Final   Value: NO GROWTH     Performed at [facility]   Culture     Final   Value: NO GROWTH     Performed at [facility]   Report Status 12/17/2012 FINAL   Final  MRSA PCR SCREENING     Status: None   Collection Time    12/16/12 10:36 PM      Result Value Range Status   MRSA by PCR NEGATIVE  NEGATIVE Final   Comment:            The GeneXpert MRSA Assay (FDA     approved for NASAL specimens     only), is one component of a     comprehensive MRSA colonization  surveillance program. It is not     intended to diagnose MRSA     infection nor to guide or     monitor treatment for     MRSA infections.     Labs: Basic Metabolic Panel:  Recent Labs Lab 12/16/12 1745 12/17/12 0340 12/18/12 0335 12/19/12 0330 12/20/12 0350  NA 139 137 137 137 138  K 4.1 4.2 4.2 4.0 3.9  CL 98 97 96 94* 92*  CO2 32 35* 36* 37* 39*  GLUCOSE 108* 112* 121* 109* 103*  BUN 9 8 11 11 12   CREATININE 0.77 0.79 0.76 0.66 0.75  CALCIUM 9.1 9.0 9.5 9.4 9.5   Liver Function Tests:  Recent Labs Lab 12/16/12 1745  AST 14  ALT 38*   ALKPHOS 128*  BILITOT 0.4  PROT 6.9  ALBUMIN 3.4*   No results found for this basename: LIPASE, AMYLASE,  in the last 168 hours No results found for this basename: AMMONIA,  in the last 168 hours CBC:  Recent Labs Lab 12/16/12 1745 12/17/12 0340 12/18/12 0335 12/19/12 0330 12/20/12 0350  WBC 15.6* 16.0* 11.2* 9.7 10.1  NEUTROABS 12.6*  --   --   --   --   HGB 12.7 12.5 12.1 12.3 12.3  HCT 42.4 41.4 42.5 42.1 41.9  MCV 90.6 92.2 93.4 91.9 91.3  PLT 244 233 205 249 260   Cardiac Enzymes:  Recent Labs Lab 12/16/12 2340 12/17/12 0340 12/17/12 1015  TROPONINI <0.30 <0.30 <0.30   BNP: BNP (last 3 results)  Recent Labs  12/08/12 0005 12/16/12 1654  PROBNP 193.8* 41.4   CBG: No results found for this basename: GLUCAP,  in the last 168 hours     Signed:  Chaya Jan  [facility] Pager: [phone] 12/20/2012, 12:05 PM

## 2012-12-23 LAB — CULTURE, BLOOD (ROUTINE X 2): Culture: NO GROWTH

## 2013-04-05 ENCOUNTER — Inpatient Hospital Stay (HOSPITAL_COMMUNITY)
Admission: EM | Admit: 2013-04-05 | Discharge: 2013-04-10 | DRG: 291 | Disposition: A | Discharge status: Home Health | Payer: Medicaid Other | Attending: Internal Medicine | Admitting: Internal Medicine

## 2013-04-05 ENCOUNTER — Encounter (HOSPITAL_COMMUNITY): Payer: Self-pay | Admitting: Emergency Medicine

## 2013-04-05 ENCOUNTER — Emergency Department (HOSPITAL_COMMUNITY): Payer: Medicaid Other

## 2013-04-05 DIAGNOSIS — Z6841 Body Mass Index (BMI) 40.0 and over, adult: Secondary | ICD-10-CM

## 2013-04-05 DIAGNOSIS — I5033 Acute on chronic diastolic (congestive) heart failure: Principal | ICD-10-CM | POA: Diagnosis present

## 2013-04-05 DIAGNOSIS — Z8249 Family history of ischemic heart disease and other diseases of the circulatory system: Secondary | ICD-10-CM

## 2013-04-05 DIAGNOSIS — Z79899 Other long term (current) drug therapy: Secondary | ICD-10-CM

## 2013-04-05 DIAGNOSIS — N61 Mastitis without abscess: Secondary | ICD-10-CM | POA: Diagnosis present

## 2013-04-05 DIAGNOSIS — J811 Chronic pulmonary edema: Secondary | ICD-10-CM | POA: Diagnosis present

## 2013-04-05 DIAGNOSIS — R600 Localized edema: Secondary | ICD-10-CM

## 2013-04-05 DIAGNOSIS — I5031 Acute diastolic (congestive) heart failure: Secondary | ICD-10-CM | POA: Diagnosis present

## 2013-04-05 DIAGNOSIS — L0390 Cellulitis, unspecified: Secondary | ICD-10-CM

## 2013-04-05 DIAGNOSIS — R609 Edema, unspecified: Secondary | ICD-10-CM

## 2013-04-05 DIAGNOSIS — N920 Excessive and frequent menstruation with regular cycle: Secondary | ICD-10-CM | POA: Diagnosis present

## 2013-04-05 DIAGNOSIS — Z91013 Allergy to seafood: Secondary | ICD-10-CM

## 2013-04-05 DIAGNOSIS — R0600 Dyspnea, unspecified: Secondary | ICD-10-CM

## 2013-04-05 DIAGNOSIS — J9600 Acute respiratory failure, unspecified whether with hypoxia or hypercapnia: Secondary | ICD-10-CM | POA: Diagnosis present

## 2013-04-05 DIAGNOSIS — D500 Iron deficiency anemia secondary to blood loss (chronic): Secondary | ICD-10-CM | POA: Diagnosis present

## 2013-04-05 DIAGNOSIS — I509 Heart failure, unspecified: Secondary | ICD-10-CM

## 2013-04-05 DIAGNOSIS — E662 Morbid (severe) obesity with alveolar hypoventilation: Secondary | ICD-10-CM | POA: Diagnosis present

## 2013-04-05 DIAGNOSIS — Z833 Family history of diabetes mellitus: Secondary | ICD-10-CM

## 2013-04-05 DIAGNOSIS — L0291 Cutaneous abscess, unspecified: Secondary | ICD-10-CM

## 2013-04-05 DIAGNOSIS — G4733 Obstructive sleep apnea (adult) (pediatric): Secondary | ICD-10-CM | POA: Diagnosis present

## 2013-04-05 DIAGNOSIS — J45909 Unspecified asthma, uncomplicated: Secondary | ICD-10-CM

## 2013-04-05 LAB — CBC WITH DIFFERENTIAL/PLATELET
BASOS PCT: 0 % (ref 0–1)
Basophils Absolute: 0 10*3/uL (ref 0.0–0.1)
EOS ABS: 0.3 10*3/uL (ref 0.0–0.7)
Eosinophils Relative: 3 % (ref 0–5)
HEMATOCRIT: 39.7 % (ref 36.0–46.0)
HEMOGLOBIN: 11.9 g/dL — AB (ref 12.0–15.0)
Lymphocytes Relative: 19 % (ref 12–46)
Lymphs Abs: 1.9 10*3/uL (ref 0.7–4.0)
MCH: 27 pg (ref 26.0–34.0)
MCHC: 30 g/dL (ref 30.0–36.0)
MCV: 90.2 fL (ref 78.0–100.0)
MONOS PCT: 9 % (ref 3–12)
Monocytes Absolute: 0.9 10*3/uL (ref 0.1–1.0)
Neutro Abs: 6.6 10*3/uL (ref 1.7–7.7)
Neutrophils Relative %: 68 % (ref 43–77)
Platelets: 251 10*3/uL (ref 150–400)
RBC: 4.4 MIL/uL (ref 3.87–5.11)
RDW: 15.1 % (ref 11.5–15.5)
WBC: 9.7 10*3/uL (ref 4.0–10.5)

## 2013-04-05 LAB — BASIC METABOLIC PANEL
BUN: 9 mg/dL (ref 6–23)
CO2: 35 mEq/L — ABNORMAL HIGH (ref 19–32)
CREATININE: 0.67 mg/dL (ref 0.50–1.10)
Calcium: 9.1 mg/dL (ref 8.4–10.5)
Chloride: 96 mEq/L (ref 96–112)
GFR calc Af Amer: >90 mL/min (ref >90)
GFR calc non Af Amer: >90 mL/min (ref >90)
Glucose, Bld: 79 mg/dL (ref 70–99)
Potassium: 4.6 mEq/L (ref 3.7–5.3)
Sodium: 140 mEq/L (ref 137–147)

## 2013-04-05 LAB — POCT I-STAT, CHEM 8
BUN: 10 mg/dL (ref 6–23)
CALCIUM ION: 0.86 mmol/L — AB (ref 1.12–1.23)
Chloride: 102 mEq/L (ref 96–112)
Creatinine, Ser: 0.7 mg/dL (ref 0.50–1.10)
Glucose, Bld: 88 mg/dL (ref 70–99)
HCT: 38 % (ref 36.0–46.0)
HEMOGLOBIN: 12.9 g/dL (ref 12.0–15.0)
Potassium: 6.8 mEq/L (ref 3.7–5.3)
Sodium: 135 mEq/L — ABNORMAL LOW (ref 137–147)
TCO2: 32 mmol/L (ref 0–100)

## 2013-04-05 LAB — POCT I-STAT TROPONIN I: Troponin i, poc: 0 ng/mL (ref 0.00–0.08)

## 2013-04-05 LAB — URINE MICROSCOPIC-ADD ON

## 2013-04-05 LAB — URINALYSIS, ROUTINE W REFLEX MICROSCOPIC
Glucose, UA: NEGATIVE mg/dL
Ketones, ur: NEGATIVE mg/dL
Nitrite: NEGATIVE
PROTEIN: 30 mg/dL — AB
Specific Gravity, Urine: 1.023 (ref 1.005–1.030)
UROBILINOGEN UA: 1 mg/dL (ref 0.0–1.0)
pH: 6 (ref 5.0–8.0)

## 2013-04-05 LAB — POCT PREGNANCY, URINE: Preg Test, Ur: NEGATIVE

## 2013-04-05 LAB — PRO B NATRIURETIC PEPTIDE: Pro B Natriuretic peptide (BNP): 10.6 pg/mL (ref 0–125)

## 2013-04-05 MED ORDER — ALBUTEROL SULFATE (2.5 MG/3ML) 0.083% IN NEBU
2.5000 mg | INHALATION_SOLUTION | Freq: Four times a day (QID) | RESPIRATORY_TRACT | Status: DC
  Administered 2013-04-05 – 2013-04-06 (×5): 2.5 mg via RESPIRATORY_TRACT
  Filled 2013-04-05 (×5): qty 3

## 2013-04-05 MED ORDER — ONDANSETRON HCL 4 MG PO TABS: 4.0000 mg | ORAL_TABLET | Freq: Four times a day (QID) | ORAL | Status: DC | PRN

## 2013-04-05 MED ORDER — BIOTENE DRY MOUTH MT LIQD
15.0000 mL | Freq: Two times a day (BID) | OROMUCOSAL | Status: DC
  Administered 2013-04-05 – 2013-04-10 (×10): 15 mL via OROMUCOSAL

## 2013-04-05 MED ORDER — NORETHINDRONE ACETATE 5 MG PO TABS
5.0000 mg | ORAL_TABLET | Freq: Every day | ORAL | Status: DC
  Administered 2013-04-06 – 2013-04-10 (×5): 5 mg via ORAL
  Filled 2013-04-05 (×7): qty 1

## 2013-04-05 MED ORDER — CLINDAMYCIN PHOSPHATE 600 MG/50ML IV SOLN
600.0000 mg | Freq: Once | INTRAVENOUS | Status: AC
  Administered 2013-04-05: 600 mg via INTRAVENOUS
  Filled 2013-04-05: qty 50

## 2013-04-05 MED ORDER — POLYETHYLENE GLYCOL 3350 17 G PO PACK
17.0000 g | PACK | Freq: Every day | ORAL | Status: DC
Start: 2013-04-05 — End: 2013-04-10
  Filled 2013-04-05 (×6): qty 1

## 2013-04-05 MED ORDER — CLINDAMYCIN PHOSPHATE 600 MG/50ML IV SOLN
600.0000 mg | Freq: Three times a day (TID) | INTRAVENOUS | Status: DC
  Administered 2013-04-05 – 2013-04-08 (×10): 600 mg via INTRAVENOUS
  Filled 2013-04-05 (×14): qty 50

## 2013-04-05 MED ORDER — SODIUM CHLORIDE 0.9 % IJ SOLN
3.0000 mL | Freq: Two times a day (BID) | INTRAMUSCULAR | Status: DC
  Administered 2013-04-05 – 2013-04-10 (×10): 3 mL via INTRAVENOUS

## 2013-04-05 MED ORDER — ACETAMINOPHEN 325 MG PO TABS
650.0000 mg | ORAL_TABLET | Freq: Four times a day (QID) | ORAL | Status: DC | PRN
  Administered 2013-04-08: 650 mg via ORAL
  Filled 2013-04-05: qty 2

## 2013-04-05 MED ORDER — FUROSEMIDE 10 MG/ML IJ SOLN
40.0000 mg | Freq: Once | INTRAMUSCULAR | Status: AC
  Administered 2013-04-05: 40 mg via INTRAVENOUS
  Filled 2013-04-05: qty 4

## 2013-04-05 MED ORDER — VITAMIN D (ERGOCALCIFEROL) 1.25 MG (50000 UNIT) PO CAPS
50000.0000 [IU] | ORAL_CAPSULE | ORAL | Status: DC
  Administered 2013-04-07: 50000 [IU] via ORAL
  Filled 2013-04-05: qty 1

## 2013-04-05 MED ORDER — FERROUS SULFATE 325 (65 FE) MG PO TABS
325.0000 mg | ORAL_TABLET | Freq: Three times a day (TID) | ORAL | Status: DC
  Administered 2013-04-06 – 2013-04-10 (×14): 325 mg via ORAL
  Filled 2013-04-05 (×16): qty 1

## 2013-04-05 MED ORDER — HEPARIN SODIUM (PORCINE) 5000 UNIT/ML IJ SOLN
5000.0000 [IU] | Freq: Three times a day (TID) | INTRAMUSCULAR | Status: DC
  Administered 2013-04-05 – 2013-04-10 (×13): 5000 [IU] via SUBCUTANEOUS
  Filled 2013-04-05 (×17): qty 1

## 2013-04-05 MED ORDER — ALBUTEROL SULFATE (2.5 MG/3ML) 0.083% IN NEBU: 2.5000 mg | INHALATION_SOLUTION | Freq: Every day | RESPIRATORY_TRACT | Status: DC | PRN

## 2013-04-05 MED ORDER — ACETAMINOPHEN 650 MG RE SUPP: 650.0000 mg | Freq: Four times a day (QID) | RECTAL | Status: DC | PRN

## 2013-04-05 MED ORDER — HYDROCODONE-ACETAMINOPHEN 5-325 MG PO TABS
1.0000 | ORAL_TABLET | ORAL | Status: DC | PRN
  Administered 2013-04-05 – 2013-04-08 (×2): 2 via ORAL
  Filled 2013-04-05 (×5): qty 2

## 2013-04-05 MED ORDER — ALBUTEROL SULFATE HFA 108 (90 BASE) MCG/ACT IN AERS: 2.0000 | INHALATION_SPRAY | Freq: Every day | RESPIRATORY_TRACT | Status: DC | PRN

## 2013-04-05 MED ORDER — FUROSEMIDE 10 MG/ML IJ SOLN
40.0000 mg | Freq: Three times a day (TID) | INTRAMUSCULAR | Status: DC
Start: 2013-04-05 — End: 2013-04-08
  Administered 2013-04-05 – 2013-04-08 (×9): 40 mg via INTRAVENOUS
  Filled 2013-04-05 (×10): qty 4

## 2013-04-05 MED ORDER — ASPIRIN-ACETAMINOPHEN-CAFFEINE 250-250-65 MG PO TABS
2.0000 | ORAL_TABLET | Freq: Four times a day (QID) | ORAL | Status: DC | PRN
  Filled 2013-04-05: qty 2

## 2013-04-05 MED ORDER — ONDANSETRON HCL 4 MG/2ML IJ SOLN: 4.0000 mg | Freq: Four times a day (QID) | INTRAMUSCULAR | Status: DC | PRN

## 2013-04-05 MED ORDER — MORPHINE SULFATE 2 MG/ML IJ SOLN: 2.0000 mg | INTRAMUSCULAR | Status: DC | PRN

## 2013-04-05 MED ORDER — ALUM & MAG HYDROXIDE-SIMETH 200-200-20 MG/5ML PO SUSP: 30.0000 mL | Freq: Four times a day (QID) | ORAL | Status: DC | PRN

## 2013-04-05 NOTE — H&P (Signed)
[facility] History and Physical  Isabel Roberts VHQ:[id number] DOB: June 28, 1980 DOA: 04/05/2013  Referring physician: Arthor CaptainAbigail Harris, PA-C PCP: Florentina JennyRIPP, HENRY, MD   Chief Complaint: Shortness of breath  HPI: Isabel Roberts is a 33 y.o.African American female with super obesity, history of chronic diastolic CHF, OSA/hypoventilation syndrome, she is on CPAP at home. Patient said for the past 3 weeks she started to have redness and pain in her left breast, in the same time she started to have shortness of breath, swelling in her legs and cough. Patient came in to the ED because of worsening of her symptoms. Patient also mentioned that she had vaginal bleeding for 3 days and now to stop, she thinks this is might be her period. In the ED initial oxygen saturation was 70% on room air, patient was placed on rebreather mask and was successful to wean off to 4 L of oxygen via nasal cannula.   Review of Systems:  Constitutional: negative for anorexia, fevers and sweats Eyes: negative for irritation, redness and visual disturbance Ears, nose, mouth, throat, and face: negative for earaches, epistaxis, nasal congestion and sore throat Respiratory: negative for cough, dyspnea on exertion, sputum and wheezing Cardiovascular: Shortness of breath, lower extremity edema and orthopnea Gastrointestinal: negative for abdominal pain, constipation, diarrhea, melena, nausea and vomiting Genitourinary:negative for dysuria, frequency and hematuria Hematologic/lymphatic: negative for bleeding, easy bruising and lymphadenopathy Musculoskeletal:negative for arthralgias, muscle weakness and stiff joints Neurological: negative for coordination problems, gait problems, headaches and weakness Endocrine: negative for diabetic symptoms including polydipsia, polyuria and weight loss Allergic/Immunologic: negative for anaphylaxis, hay fever and urticaria   Past Medical History  Diagnosis Date  . Sleep apnea   . Pneumonia    . Obesity   . Vaginal bleeding   . Pneumonia    Past Surgical History  Procedure Laterality Date  . Vagina surgery  09/2011   Social History:  reports that she has never smoked. She does not have any smokeless tobacco history on file. She reports that she drinks alcohol. She reports that she does not use illicit drugs.  Allergies  Allergen Reactions  . Fish Allergy Hives and Other (See Comments)    Throat swells  . Other Other (See Comments)    Grass causes a rash  . Shrimp [Shellfish Allergy] Rash    Family History  Problem Relation Age of Onset  . Diabetes Mother   . Hypertension Mother   . Hypertension Father   . Diabetes Father      Prior to Admission medications   Medication Sig Start Date End Date Taking? Authorizing Provider  aspirin-acetaminophen-caffeine (EXCEDRIN MIGRAINE) [phone] MG per tablet Take 2 tablets by mouth every 6 (six) hours as needed for headache.   Yes Historical Provider, MD  ferrous sulfate 325 (65 FE) MG tablet Take 325 mg by mouth 3 (three) times daily with meals.   Yes Historical Provider, MD  furosemide (LASIX) 40 MG tablet Take 1 tablet (40 mg total) by mouth daily. 12/12/12  Yes Marinda ElkAbraham Feliz Ortiz, MD  norethindrone (AYGESTIN) 5 MG tablet Take 5 mg by mouth daily.   Yes Historical Provider, MD  Vitamin D, Ergocalciferol, (DRISDOL) 50000 UNITS CAPS capsule Take 50,000 Units by mouth every 7 (seven) days. Tuesdays   Yes Historical Provider, MD  VITAMIN E PO Take 1 capsule by mouth daily.   Yes Historical Provider, MD  albuterol (PROVENTIL HFA;VENTOLIN HFA) 108 (90 BASE) MCG/ACT inhaler Inhale 2 puffs into the lungs daily as needed for wheezing or shortness  of breath.    Historical Provider, MD   Physical Exam: Filed Vitals:   04/05/13 1630  BP: 119/51  Pulse: 91  Temp:   Resp:     BP 119/51  Pulse 91  Temp(Src) 98.2 F (36.8 C) (Oral)  Resp 25  SpO2 100%  LMP 03/25/2013  General:  Super obese African American female appears  comfortable Eyes: PERRL, normal lids, irises & conjunctiva ENT: grossly normal hearing, lips & tongue Neck: no LAD, masses or thyromegaly Cardiovascular: RRR, no m/r/g. Has probably +1 edema but is really hard to tell with her massive legs Telemetry: SR, no arrhythmias  Respiratory: CTA bilaterally, no w/r/r. Normal respiratory effort. Abdomen: soft, ntnd Skin: no rash or induration seen on limited exam Musculoskeletal: grossly normal tone BUE/BLE Psychiatric: grossly normal mood and affect, speech fluent and appropriate Neurologic: grossly non-focal.          Labs on Admission:  Basic Metabolic Panel:  Recent Labs Lab 04/05/13 1443 04/05/13 1523  NA 135* 140  K 6.8* 4.6  CL 102 96  CO2  --  35*  GLUCOSE 88 79  BUN 10 9  CREATININE 0.70 0.67  CALCIUM  --  9.1   Liver Function Tests: No results found for this basename: AST, ALT, ALKPHOS, BILITOT, PROT, ALBUMIN,  in the last 168 hours No results found for this basename: LIPASE, AMYLASE,  in the last 168 hours No results found for this basename: AMMONIA,  in the last 168 hours CBC:  Recent Labs Lab 04/05/13 1443 04/05/13 1523  WBC  --  9.7  NEUTROABS  --  6.6  HGB 12.9 11.9*  HCT 38.0 39.7  MCV  --  90.2  PLT  --  251   Cardiac Enzymes: No results found for this basename: CKTOTAL, CKMB, CKMBINDEX, TROPONINI,  in the last 168 hours  BNP (last 3 results)  Recent Labs  12/08/12 0005 12/16/12 1654 04/05/13 1523  PROBNP 193.8* 41.4 10.6   CBG: No results found for this basename: GLUCAP,  in the last 168 hours  Radiological Exams on Admission: Dg Chest Port 1 View  04/05/2013   CLINICAL DATA:  Shortness of breath  EXAM: PORTABLE CHEST - 1 VIEW  COMPARISON:  12/16/2012  FINDINGS: Cardiac shadow is enlarged. Pulmonary vascular congestion is again noted similar to that seen on the prior exam. No focal infiltrate or acute bony abnormality is noted.  IMPRESSION: Congestive failure.   Electronically Signed   By: Alcide Clever M.D.   On: 04/05/2013 15:13    EKG: Independently reviewed.  Assessment/Plan Active Problems:   Morbid obesity   OSA (obstructive sleep apnea)   Bilateral lower extremity edema   Acute respiratory failure   Acute on chronic diastolic CHF (congestive heart failure)   Mastitis, left, acute   Acute diastolic CHF -Patient admitted to the hospital for further evaluation and diuresis. -Recent 2-D echo in September showed LVEF of 60%, couldn't quantify the diastolic function because of body habitus. -Acute CHF with preserved LVEF. -Start aggressive diuresis with 40 mg of Lasix 3 times a day. -Patient might need to synergistic diuretic like Zaroxolyn or Aldactone. -Restrict fluids 1200 mL per day, low sodium diet and check intake/output/weight daily.  Acute hypoxic respiratory failure -Oxygen saturation was 70% on room air. -Was briefly on a rebreather mask, now 4 L of oxygen, wean off of oxygen as tolerated. -Part of it is likely secondary to apnea/hypopnea syndrome.  Acute left breast cellulitis -Patient started on clindamycin,  I will continue.  Sleep apnea -Patient is on CPAP at home, continue CPAP.  Super obesity -No weight until now but last one from September was 749 pounds. -Patient probably need evaluation as outpatient for surgical help to control her weight   Code Status: Full code  Family Communication: Plan discussed with patient  Disposition Plan: Inpatient, telemetry, anticipate length of stay to be greater than 2 midnights  Time spent: 70 minutes  [facility] Pager [phone]

## 2013-04-05 NOTE — Progress Notes (Signed)
Placed pt. On cpap. Pt. Is tolerating well. Pt. Has 4L oxygen titrated into the cpap.

## 2013-04-05 NOTE — ED Notes (Signed)
IV team paged.  

## 2013-04-05 NOTE — ED Provider Notes (Signed)
CSN: [id number]     Arrival date & time 04/05/13  1219 History   First MD Initiated Contact with Patient 04/05/13 1233     Chief Complaint  Patient presents with  . Shortness of Breath  . Leg Swelling   (Consider location/radiation/quality/duration/timing/severity/associated sxs/prior Treatment) The history is provided by the patient and medical records. No language interpreter was used.    Isabel Roberts is a pleasant, super morbidly obese, 33 y.o. female with known history of obstructive sleep apnea/obesity hypoventilation syndrome on BiPAP at home, and  Hx of asthma. The patient presents with worsening SOB, Peripheral edema.  Patient stated that she was short of breath at rest however worse with exertion.  The patient currently prescribed furosemide 40 mg once daily, however she has been taking 40 mg twice a day without relief of her symptoms. Denies any chest pain.  EMS reports that patient had an initial O2 saturation of 70%.  Patient has been comfortable in the ED on 4 L via nasal cannula.  She denies any wheezing, cough, symptoms of upper respiratory infection or chest pain.  Marland Kitchen  Previous 2-D echo showed EF of 60%.  The patient also complains of pain, redness, heat and swelling to the left breast.  Patient states that she it began as a bug bite several days ago.  It has progressively worsened and spread around the front of the breast to the nipple.  Patient denies any nipple discharge.  She complains that it is exquisitely tender to palpation.  No history of skin infections.  Past Medical History  Diagnosis Date  . Sleep apnea   . Pneumonia   . Obesity   . Vaginal bleeding   . Pneumonia    Past Surgical History  Procedure Laterality Date  . Vagina surgery  09/2011   Family History  Problem Relation Age of Onset  . Diabetes Mother   . Hypertension Mother   . Hypertension Father   . Diabetes Father    History  Substance Use Topics  . Smoking status: Never Smoker   . Smokeless  tobacco: Not on file  . Alcohol Use: Yes     Comment: Pt states "I drink every now and again"   OB History   Grav Para Term Preterm Abortions TAB SAB Ect Mult Living   1 1        0     Review of Systems Ten systems reviewed and are negative for acute change, except as noted in the HPI.   Allergies  Fish allergy; Other; and Shrimp  Home Medications   Current Outpatient Rx  Name  Route  Sig  Dispense  Refill  . aspirin-acetaminophen-caffeine (EXCEDRIN MIGRAINE) 250-250-65 MG per tablet   Oral   Take 2 tablets by mouth every 6 (six) hours as needed for headache.         . ferrous sulfate 325 (65 FE) MG tablet   Oral   Take 325 mg by mouth 3 (three) times daily with meals.         . furosemide (LASIX) 40 MG tablet   Oral   Take 1 tablet (40 mg total) by mouth daily.   30 tablet   0   . norethindrone (AYGESTIN) 5 MG tablet   Oral   Take 5 mg by mouth daily.         . Vitamin D, Ergocalciferol, (DRISDOL) 50000 UNITS CAPS capsule   Oral   Take 50,000 Units by mouth every 7 (seven) days.  Tuesdays         . VITAMIN E PO   Oral   Take 1 capsule by mouth daily.         Marland Kitchen albuterol (PROVENTIL HFA;VENTOLIN HFA) 108 (90 BASE) MCG/ACT inhaler   Inhalation   Inhale 2 puffs into the lungs daily as needed for wheezing or shortness of breath.          BP 119/51  Pulse 91  Temp(Src) 98.2 F (36.8 C) (Oral)  Resp 25  SpO2 100%  LMP 03/25/2013 Physical Exam Physical Exam  Nursing note and vitals reviewed. Constitutional: A very pleasant super morbidly obese female appear  HENT:  Head: Normocephalic and atraumatic.  Eyes: Conjunctivae normal and EOM are normal. Pupils are equal, round, and reactive to light. No scleral icterus.  Neck: Normal range of motion.  Cardiovascular: Normal rate, regular rhythm and normal heart sounds.  Exam reveals no gallop and no friction rub.   No murmur heard. Pulmonary/Chest: Increased respiratory effort.  No wheezing noted.  Exam  is limited due to body habitus Abdominal: Soft exam is limited due to body habitus.  Neurological: She is alert and oriented to person, place, and time.  Skin: Patient has heat, redness, swelling of the left breast.  It does not involve the folds of her skin.  Her signs of chronic lymphedema changes in the bilateral legs.  No pitting edema noted.   ED Course  Procedures (including critical care time) Labs Review Labs Reviewed  CBC WITH DIFFERENTIAL - Abnormal; Notable for the following:    Hemoglobin 11.9 (*)    All other components within normal limits  BASIC METABOLIC PANEL - Abnormal; Notable for the following:    CO2 35 (*)    All other components within normal limits  URINALYSIS, ROUTINE W REFLEX MICROSCOPIC - Abnormal; Notable for the following:    Color, Urine AMBER (*)    APPearance CLOUDY (*)    Hgb urine dipstick LARGE (*)    Bilirubin Urine SMALL (*)    Protein, ur 30 (*)    Leukocytes, UA TRACE (*)    All other components within normal limits  URINE MICROSCOPIC-ADD ON - Abnormal; Notable for the following:    Squamous Epithelial / LPF MANY (*)    Casts GRANULAR CAST (*)    All other components within normal limits  POCT I-STAT, CHEM 8 - Abnormal; Notable for the following:    Sodium 135 (*)    Potassium 6.8 (*)    Calcium, Ion 0.86 (*)    All other components within normal limits  URINE CULTURE  PRO B NATRIURETIC PEPTIDE  POCT PREGNANCY, URINE  POCT I-STAT TROPONIN I   Imaging Review Dg Chest Port 1 View  04/05/2013   CLINICAL DATA:  Shortness of breath  EXAM: PORTABLE CHEST - 1 VIEW  COMPARISON:  12/16/2012  FINDINGS: Cardiac shadow is enlarged. Pulmonary vascular congestion is again noted similar to that seen on the prior exam. No focal infiltrate or acute bony abnormality is noted.  IMPRESSION: Congestive failure.   Electronically Signed   By: Alcide Clever M.D.   On: 04/05/2013 15:13    EKG Interpretation    Date/Time:  Sunday April 05 2013 12:33:15  EST Ventricular Rate:  83 PR Interval:  170 QRS Duration: 80 QT Interval:  374 QTC Calculation: 439 R Axis:   91 Text Interpretation:  Sinus rhythm Borderline right axis deviation Low voltage, precordial leads Confirmed by MASNERI  MD, DAVID ([id number]) on  04/05/2013 1:49:34 PM            MDM   1. Pulmonary edema   2. Cellulitis   3. Dyspnea    CXR shows chf,  Labs w/ proteinuria. Elevated C02 Patient with pulmonary congestion, multiple morbidities, left breast cellulitis.  Patient has been unable to ambulate at home with her walker, worsening shortness of breath.  She would need admission in for diuresis returned to baseline function. Ardelle AntonC   Ryelle Ruvalcaba, PA-C 04/08/13 1143

## 2013-04-05 NOTE — ED Provider Notes (Signed)
Angiocath insertion Performed by: Redgie GrayerMASNERI, Sahana Boyland  Consent: Verbal consent obtained. Risks and benefits: risks, benefits and alternatives were discussed Time out: Immediately prior to procedure a "time out" was called to verify the correct patient, procedure, equipment, support staff and site/side marked as required.  Preparation: Patient was prepped and draped in the usual sterile fashion.  Vein Location: R forarme  Ultrasound Guided  Gauge: 18 gauge  Normal blood return and flush without difficulty Patient tolerance: Patient tolerated the procedure well with no immediate complications.    Medical screening examination/treatment/procedure(s) were conducted as a shared visit with non-physician practitioner(s) and myself.  I personally evaluated the patient during the encounter.  EKG Interpretation    Date/Time:  Sunday April 05 2013 12:33:15 EST Ventricular Rate:  83 PR Interval:  170 QRS Duration: 80 QT Interval:  374 QTC Calculation: 439 R Axis:   91 Text Interpretation:  Sinus rhythm Borderline right axis deviation Low voltage, precordial leads Confirmed by [facility]  MD, Latashia Koch ([id number]) on 04/05/2013 1:49:34 PM                 Darlys Galesavid Mykaela Arena, MD 04/05/13 [phone]

## 2013-04-05 NOTE — ED Notes (Signed)
Pt reports increasing SOB over the past 2 weeks. Denies CP. Pt also reports leg and left sided arm and left breast swelling. Pt also reports she recently had episode when she didn't have a period for a month and a half then all of a sudden started bleeding a lot and had several blood clots. Bleeding has resolved.

## 2013-04-05 NOTE — ED Notes (Signed)
IV team at bedside 

## 2013-04-06 DIAGNOSIS — I5031 Acute diastolic (congestive) heart failure: Secondary | ICD-10-CM | POA: Diagnosis present

## 2013-04-06 LAB — BASIC METABOLIC PANEL
BUN: 9 mg/dL (ref 6–23)
CALCIUM: 8.9 mg/dL (ref 8.4–10.5)
CO2: 37 meq/L — AB (ref 19–32)
CREATININE: 0.75 mg/dL (ref 0.50–1.10)
Chloride: 97 mEq/L (ref 96–112)
GFR calc Af Amer: >90 mL/min (ref >90)
Glucose, Bld: 87 mg/dL (ref 70–99)
Potassium: 4.5 mEq/L (ref 3.7–5.3)
SODIUM: 142 meq/L (ref 137–147)

## 2013-04-06 LAB — CBC
HCT: 37.1 % (ref 36.0–46.0)
Hemoglobin: 11 g/dL — ABNORMAL LOW (ref 12.0–15.0)
MCH: 26.4 pg (ref 26.0–34.0)
MCHC: 29.6 g/dL — ABNORMAL LOW (ref 30.0–36.0)
MCV: 89 fL (ref 78.0–100.0)
PLATELETS: 245 10*3/uL (ref 150–400)
RBC: 4.17 MIL/uL (ref 3.87–5.11)
RDW: 15.2 % (ref 11.5–15.5)
WBC: 6.5 10*3/uL (ref 4.0–10.5)

## 2013-04-06 LAB — URINE CULTURE
COLONY COUNT: NO GROWTH
Culture: NO GROWTH

## 2013-04-06 LAB — TSH: TSH: 4.099 u[IU]/mL (ref 0.350–4.500)

## 2013-04-06 LAB — MAGNESIUM: MAGNESIUM: 2.1 mg/dL (ref 1.5–2.5)

## 2013-04-06 MED ORDER — FLUCONAZOLE IN SODIUM CHLORIDE 200-0.9 MG/100ML-% IV SOLN: 200.0000 mg | INTRAVENOUS | Status: DC

## 2013-04-06 MED ORDER — FLUCONAZOLE IN SODIUM CHLORIDE 400-0.9 MG/200ML-% IV SOLN
400.0000 mg | INTRAVENOUS | Status: DC
  Administered 2013-04-07 – 2013-04-08 (×2): 400 mg via INTRAVENOUS
  Filled 2013-04-06 (×3): qty 200

## 2013-04-06 MED ORDER — FLUCONAZOLE IN SODIUM CHLORIDE 400-0.9 MG/200ML-% IV SOLN
400.0000 mg | Freq: Once | INTRAVENOUS | Status: AC
  Administered 2013-04-06: 400 mg via INTRAVENOUS
  Filled 2013-04-06: qty 200

## 2013-04-06 NOTE — Progress Notes (Signed)
Isabel Roberts UJW:[id number] DOB: 31-May-1980 DOA: 04/05/2013 PCP: Isabel Jenny, MD  Brief narrative: 33 year old, Body mass index is 144.82 kg/(m^2)., chronic diastolic CHF, OSA, OHS, on BiPAP, hypermenorrhea African American female admitted 04/05/13 with acute respiratory failure O2 sat 70%, nonrebreather = weaned to 4 L nasal cannula  Past medical history-As per Problem list Chart reviewed as below- reviewed  Consultants:  none  Procedures:  none  Antibiotics:  none   Subjective  Alert.  On bipap.  No n/v/cp Tol CPAP States feels a little better now on Bipap   Objective    Interim History: none  Telemetry: nsr   Objective: Filed Vitals:   04/05/13 2151 04/05/13 2236 04/06/13 0609 04/06/13 0829  BP:   113/62   Pulse:  95 84   Temp:   98.4 F (36.9 C)   TempSrc:   Oral   Resp:  18 18   Height:      Weight:      SpO2: 98% 98% 92% 100%    Intake/Output Summary (Last 24 hours) at 04/06/13 0900 Last data filed at 04/06/13 0609  Gross per 24 hour  Intake    440 ml  Output   7001 ml  Net  -6561 ml    Exam:  General: Body mass index is 144.82 kg/(m^2). Cardiovascular: s1 s2 no m/r/g, chest on l side=swollen, moisture under folds of l breat Respiratory: cannot meaningfully assess Abdomen: cannot assess Skin Massive Le edema Neuro ROm intact  Data Reviewed: Basic Metabolic Panel:  Recent Labs Lab 04/05/13 1443 04/05/13 1523 04/06/13 0419  NA 135* 140 142  K 6.8* 4.6 4.5  CL 102 96 97  CO2  --  35* 37*  GLUCOSE 88 79 87  BUN 10 9 9   CREATININE 0.70 0.67 0.75  CALCIUM  --  9.1 8.9  MG  --   --  2.1   Liver Function Tests: No results found for this basename: AST, ALT, ALKPHOS, BILITOT, PROT, ALBUMIN,  in the last 168 hours No results found for this basename: LIPASE, AMYLASE,  in the last 168 hours No results found for this basename: AMMONIA,  in the last 168 hours CBC:  Recent Labs Lab 04/05/13 1443 04/05/13 1523 04/06/13 0419  WBC   --  9.7 6.5  NEUTROABS  --  6.6  --   HGB 12.9 11.9* 11.0*  HCT 38.0 39.7 37.1  MCV  --  90.2 89.0  PLT  --  251 245   Cardiac Enzymes: No results found for this basename: CKTOTAL, CKMB, CKMBINDEX, TROPONINI,  in the last 168 hours BNP: No components found with this basename: POCBNP,  CBG: No results found for this basename: GLUCAP,  in the last 168 hours  No results found for this or any previous visit (from the past 240 hour(s)).   Studies:              All Imaging reviewed and is as per above notation   Scheduled Meds: . albuterol  2.5 mg Nebulization Q6H  . antiseptic oral rinse  15 mL Mouth Rinse BID  . clindamycin (CLEOCIN) IV  600 mg Intravenous Q8H  . ferrous sulfate  325 mg Oral TID WC  . furosemide  40 mg Intravenous Q8H  . heparin  5,000 Units Subcutaneous Q8H  . norethindrone  5 mg Oral Daily  . polyethylene glycol  17 g Oral Daily  . sodium chloride  3 mL Intravenous Q12H  . [START ON 04/07/2013] Vitamin D (Ergocalciferol)  50,000 Units Oral Q Tue   Continuous Infusions:    Assessment/Plan: 1. Mastitis-Continue clinda 600 iv q 8.  Add Fluconazole per Pharmacy Acute diastolic HF-cannot accurately assess.  -8.601 so far.  IV lasix 40 q 8 hr 2. Super morbid Body mass index is 144.82 kg/(m^2).-Life threatening.  Scared of bariatric surgery as a close friend died with this.   3. Vaginal bleed-Cont Norethindrone-might need other meds if this doesn;t control.  Get CBC am.  Cont Fe SO4  Code Status: Full Family Communication: None present Disposition Plan: Unclear   Isabel KochJai Arriyanna Mersch, MD  [facility] Pager [phone] 04/06/2013, 9:00 AM    LOS: 1 day

## 2013-04-06 NOTE — Progress Notes (Signed)
Patient having c/o 7/10 leg pain. PRN Vicodin administered as ordered. Patient currently resting comfortably, pain free. Will continue to monitor. Isabel Roberts, Kemyra August M

## 2013-04-06 NOTE — Evaluation (Addendum)
Physical Therapy Evaluation Patient Details Name: Isabel Roberts MRN: [id number] DOB: [birth year] Today's Date: 04/06/2013 Time: 1140-1230 PT Time Calculation (min): 50 min  PT Assessment / Plan / Recommendation History of Present Illness  Pt admit with CHF and SOB.  Clinical Impression  Pt admitted with above. Pt currently with functional limitations due to the deficits listed below (see PT Problem List). Pt will benefit from skilled PT to increase their independence and safety with mobility to allow discharge to the venue listed below.     PT Assessment  Patient needs continued PT services    Follow Up Recommendations  CIR;Supervision - Intermittent                Equipment Recommendations  3in1 (PT) (needs bariatric 3N1)    Recommendations for Other Services Rehab consult   Frequency Min 3X/week    Precautions / Restrictions Precautions Precautions: Fall Precaution Comments: Bariatric Restrictions Weight Bearing Restrictions: No   Pertinent Vitals/Pain VSS, some bil LE pain      Mobility  Bed Mobility Overal bed mobility: Needs Assistance Bed Mobility: Rolling Rolling: Total assist Supine to sit: Total assist Sit to supine: Mod assist;+2 for physical assistance General bed mobility comments: Pt used momemtum to rol L to R, R to L. Required total A with LEs Transfers Overall transfer level: Needs assistance Transfers: Sit to/from Stand Sit to Stand: +2 physical assistance;+2 safety/equipment;From elevated surface;Min assist General transfer comment: transfers not assessed as pt requires +2-3 assist Ambulation/Gait Ambulation/Gait assistance: +2 physical assistance;+2 safety/equipment;Min assist Ambulation Distance (Feet): 2 Feet Assistive device:  (furniture) Gait Pattern/deviations: Step-to pattern;Decreased stride length;Decreased step length - right;Decreased step length - left;Trunk flexed;Wide base of support Gait velocity: decr Gait velocity  interpretation: <1.8 ft/sec, indicative of risk for recurrent falls General Gait Details: Took a few steps forward and a few back only.          PT Diagnosis: Generalized weakness  PT Problem List: Decreased activity tolerance;Decreased balance;Decreased mobility;Decreased knowledge of use of DME;Decreased safety awareness;Decreased knowledge of precautions PT Treatment Interventions: Gait training;DME instruction;Functional mobility training;Therapeutic activities;Therapeutic exercise;Balance training;Patient/family education     PT Goals(Current goals can be found in the care plan section) Acute Rehab PT Goals Patient Stated Goal: to go home after rehab PT Goal Formulation: With patient Time For Goal Achievement: 04/13/13 Potential to Achieve Goals: Good  Visit Information  Last PT Received On: 04/06/13 Assistance Needed: +3 or more History of Present Illness: Pt admit with CHF and SOB.       Prior Functioning  Home Living Family/patient expects to be discharged to:: Private residence Living Arrangements: Spouse/significant other Available Help at Discharge: Family;Available PRN/intermittently Type of Home: House Home Access: Level entry Home Layout: One level Home Equipment: Walker - 2 wheels;Shower seat Prior Function Level of Independence: Needs assistance Gait / Transfers Assistance Needed: Uses RW for short distance and has electric scooter on order.   ADL's / Homemaking Assistance Needed: pt can cook and has boyfriend A with cleaning.   Comments: Boyfriend helps pt get out of shower Communication Communication: No difficulties Dominant Hand: Right    Cognition  Cognition Arousal/Alertness: Awake/alert Behavior During Therapy: WFL for tasks assessed/performed Overall Cognitive Status: Within Functional Limits for tasks assessed    Extremity/Trunk Assessment Upper Extremity Assessment Upper Extremity Assessment: Overall WFL for tasks assessed Lower Extremity  Assessment Lower Extremity Assessment: Defer to PT evaluation RLE Deficits / Details: grossly3-/5 LLE Deficits / Details: grossly 3-/5   Balance Balance Overall balance  assessment: Needs assistance;History of Falls Sitting-balance support: Bilateral upper extremity supported;Feet supported Sitting balance-Leahy Scale: Good Standing balance support: Bilateral upper extremity supported;During functional activity Standing balance-Leahy Scale: Fair  End of Session PT - End of Session Equipment Utilized During Treatment: Gait belt;Oxygen Activity Tolerance: Patient limited by fatigue Patient left: in bed;with call bell/phone within reach Nurse Communication: Mobility status   Functional Assessment Tool Used: clinical judgement Functional Limitation: Mobility: Walking and moving around Mobility: Walking and Moving Around Current Status ([id number]): At least 20 percent but less than 40 percent impaired, limited or restricted Mobility: Walking and Moving Around Goal Status [phone]): At least 1 percent but less than 20 percent impaired, limited or restricted   INGOLD,Maresa Morash 04/06/2013, 3:51 PM [facility] Acute Rehabilitation [phone] (pager)

## 2013-04-06 NOTE — Progress Notes (Signed)
Rehab Admissions Coordinator Note:  Patient was screened by Clois DupesBoyette, Candace Begue Godwin for appropriateness for an Inpatient Acute Rehab Consult.  At this time, we are recommending Inpatient Rehab consult.  Clois DupesBoyette, Kattia Selley Godwin 04/06/2013, 9:03 PM  I can be reached at [phone].

## 2013-04-06 NOTE — Progress Notes (Signed)
ANTIBIOTIC CONSULT NOTE - INITIAL  Pharmacy Consult for Fluconazole Indication: mastitis  Allergies  Allergen Reactions  . Fish Allergy Hives and Other (See Comments)    Throat swells  . Other Other (See Comments)    Grass causes a rash  . Shrimp [Shellfish Allergy] Rash    Patient Measurements: Height: 5\' 4"  (162.6 cm) Weight:  ~844 lbs (383 kg) IBW/kg (Calculated) : 54.7  Vital Signs: Temp: 98.2 F (36.8 C) (01/12 1443) Temp src: Axillary (01/12 1443) BP: 100/53 mmHg (01/12 1443) Pulse Rate: 89 (01/12 1443)  Labs:  Recent Labs  04/05/13 1443 04/05/13 1523 04/06/13 0419  WBC  --  9.7 6.5  HGB 12.9 11.9* 11.0*  PLT  --  251 245  CREATININE 0.70 0.67 0.75   Estimated Creatinine Clearance: 299.2 ml/min (by C-G formula based on Cr of 0.75).  Microbiology: Recent Results (from the past 720 hour(s))  URINE CULTURE     Status: None   Collection Time    04/05/13  1:20 PM      Result Value Range Status   Specimen Description URINE, CATHETERIZED   Final   Special Requests NONE   Final   Culture  Setup Time     Final   Value: 04/05/2013 18:59     Performed at [facility]   Colony Count     Final   Value: NO GROWTH     Performed at [facility]   Culture     Final   Value: NO GROWTH     Performed at [facility]   Report Status 04/06/2013 FINAL   Final    Medical History: Past Medical History  Diagnosis Date  . Sleep apnea   . Pneumonia   . Obesity   . Vaginal bleeding   . Pneumonia    Assessment:   Day # 2 Clindamycin 600 mg IV q8hrs for left breast mastitis. Adding Fluconazole today.  Super morbid obesity.  Goal of Therapy:   appropriate Fluconazole for renal function and infection  Plan:   Fluconazole 400 mg IV daily.  Upper-end of dosing range to to body size.  Will follow up for possible change to PO over the next few days, as well as length of therapy.  Dennie Fettersgan, [place]y Donovan, [place] Pager: [phone] 04/06/2013,5:05 PM

## 2013-04-06 NOTE — Evaluation (Signed)
Occupational Therapy Evaluation Patient Details Name: Isabel Roberts MRN: [id number] DOB: [birth year] Today's Date: 04/06/2013 Time: [id number] OT Time Calculation (min): 15 min  OT Assessment / Plan / Recommendation History of present illness Pt admit with CHF and SOB.   Clinical Impression   Pt demos decline in function with ADLs and ADL mobility safety with decreased strength and endurance. Pt would benefit from acute OT services to address impairments to increase level of function and safety    OT Assessment  Patient needs continued OT Services    Follow Up Recommendations  CIR    Barriers to Discharge Decreased caregiver support    Equipment Recommendations  None recommended by OT;Other (comment) (TBD at next venue of care)    Recommendations for Other Services Rehab consult  Frequency  Min 2X/week    Precautions / Restrictions Precautions Precautions: Fall Precaution Comments: Bariatric Restrictions Weight Bearing Restrictions: No   Pertinent Vitals/Pain 6/10 B LEs    ADL  Grooming: Performed;Wash/dry hands;Wash/dry face Where Assessed - Grooming: Supine, head of bed up Upper Body Bathing: Simulated;Minimal assistance Where Assessed - Upper Body Bathing: Supine, head of bed up Lower Body Bathing: +1 Total assistance Upper Body Dressing: Performed;Minimal assistance Where Assessed - Upper Body Dressing: Supine, head of bed up Transfers/Ambulation Related to ADLs: transfers not assessed as pt requires +2-3 assist. Per PT, pt is Total A +2 for bed mobility to sit EOB and total A +2 min A for sit - stand/transfers ADL Comments: LB ADLs not assessed, pt supine with HOB wlwvtaed and pt requires +2-3 to sit EOB    OT Diagnosis: Acute pain  OT Problem List: Decreased activity tolerance OT Treatment Interventions: Self-care/ADL training;Therapeutic exercise;Patient/family education;Neuromuscular education;Balance training;Therapeutic activities;DME and/or AE  instruction;Energy conservation   OT Goals(Current goals can be found in the care plan section) Acute Rehab OT Goals Patient Stated Goal: to go home after rehab Time For Goal Achievement: 04/13/13 Potential to Achieve Goals: Good ADL Goals Pt Will Perform Grooming: with set-up;sitting (EOB) Pt Will Perform Upper Body Bathing: with min guard assist;with supervision;with set-up;sitting (EOB) Pt Will Perform Lower Body Bathing: with max assist;with mod assist;sitting/lateral leans;with adaptive equipment Pt Will Perform Upper Body Dressing: with min guard assist;with supervision;with set-up;sitting (EOB) Pt Will Transfer to Toilet: with total assist;with mod assist;with min assist;bedside commode Pt Will Perform Toileting - Clothing Manipulation and hygiene: with max assist;with mod assist Additional ADL Goal #1: Complete bed mobility with total A +1/ min A to sit EOB in prep for ADLs  Visit Information  Last OT Received On: 04/06/13 Assistance Needed: +3 or more History of Present Illness: Pt admit with CHF and SOB.       Prior Functioning     Home Living Family/patient expects to be discharged to:: Private residence Living Arrangements: Spouse/significant other Available Help at Discharge: Family;Available PRN/intermittently Type of Home: House Home Access: Level entry Home Layout: One level Home Equipment: Walker - 2 wheels;Shower seat Prior Function Level of Independence: Needs assistance Gait / Transfers Assistance Needed: Uses RW for short distance and has electric scooter on order.   ADL's / Homemaking Assistance Needed: pt can cook and has boyfriend A with cleaning.   Comments: Boyfriend helps pt get out of shower Communication Communication: No difficulties Dominant Hand: Right         Vision/Perception Vision - History Baseline Vision: No visual deficits Patient Visual Report: No change from baseline Perception Perception: Within Functional Limits    Cognition  Cognition Arousal/Alertness: Awake/alert Behavior  During Therapy: WFL for tasks assessed/performed Overall Cognitive Status: Within Functional Limits for tasks assessed    Extremity/Trunk Assessment Upper Extremity Assessment Upper Extremity Assessment: Overall WFL for tasks assessed Lower Extremity Assessment Lower Extremity Assessment: Defer to PT evaluation RLE Deficits / Details: grossly3-/5 LLE Deficits / Details: grossly 3-/5     Mobility Bed Mobility Overal bed mobility: Needs Assistance Bed Mobility: Rolling Rolling: Total assist Supine to sit: Total assist Sit to supine: Mod assist;+2 for physical assistance General bed mobility comments: Pt used momemtum to rol L to R, R to L. Required total A with LEs Transfers Overall transfer level: Needs assistance Transfers: Sit to/from Stand Sit to Stand: +2 physical assistance;+2 safety/equipment;From elevated surface;Min assist General transfer comment: transfers not assessed as pt requires +2-3 assist     Exercise     Balance Balance Overall balance assessment: Needs assistance;History of Falls Sitting-balance support: Bilateral upper extremity supported;Feet supported Sitting balance-Leahy Scale: Good Standing balance support: Bilateral upper extremity supported;During functional activity Standing balance-Leahy Scale: Fair   End of Session OT - End of Session Patient left: in bed  GO     Galen Manila 04/06/2013, 3:15 PM

## 2013-04-06 NOTE — Progress Notes (Signed)
Utilization Review Completed Kyana Aicher J. Astella Desir, RN, BSN, NCM [phone]  

## 2013-04-07 MED ORDER — ALBUTEROL SULFATE (2.5 MG/3ML) 0.083% IN NEBU: 2.5000 mg | INHALATION_SOLUTION | RESPIRATORY_TRACT | Status: DC | PRN

## 2013-04-07 MED ORDER — ALBUTEROL SULFATE (2.5 MG/3ML) 0.083% IN NEBU
2.5000 mg | INHALATION_SOLUTION | Freq: Three times a day (TID) | RESPIRATORY_TRACT | Status: DC
  Administered 2013-04-07 – 2013-04-10 (×11): 2.5 mg via RESPIRATORY_TRACT
  Filled 2013-04-07 (×12): qty 3

## 2013-04-07 NOTE — Care Management Note (Addendum)
    Page 1 of 2   04/10/2013     2:07:29 PM   CARE MANAGEMENT NOTE 04/10/2013  Patient:  Isabel Roberts,Isabel Roberts   Account Number:  [id number]  Date Initiated:  04/06/2013  Documentation initiated by:  [facility],Hanifah Royse  Subjective/Objective Assessment:   32 y.o.African American female with super obesity, history of chronic diastolic CHF, OSA/hypoventilation syndrome, she is on CPAP at home. In with CHF, Acute left breast cellulitis//Home with spouse     Action/Plan:   IV diurese; abx//Home with HH vs. SNF   Anticipated DC Date:  04/09/2013   Anticipated DC Plan:  HOME W HOME HEALTH SERVICES  In-house referral  Clinical Social Worker      DC Planning Services  CM consult      [facility]  D[facility]ABLE [facility] EQUIP[facility]   Choice offered to / List presented to:     DME arranged  BEDSIDE COMMODE      DME agency  [facility]        Status of service:  In process, will continue to follow Medicare Important Message given?   (If response is "NO", the following Medicare IM given date fields will be blank) Date Medicare IM given:   Date Additional Medicare IM given:    Discharge Disposition:    Per UR Regulation:  Reviewed for med. necessity/level of care/duration of stay  If discussed at Long Length of Stay Meetings, dates discussed:    Comments:  04/10/1328 Isabel Cohnamellia Aneesa Romey, RN, BSN, NCM [phone] Spoke with pt. regarding discharge planning. CM offered pt. list of home health agencies. Pt. chose [facility] to render services. [facility]. No DME needs include Bariactic Bedside Commode.  Patient does not feel she has even 50% of her strength.  Afraid she will get home and fall and or need to come back to hospital.  Pt states she is in need of new sleep study for new CPAP at home.  Pt also states she will need ambulance ride home (relayed to SW) at discharge.  .04/07/2013 Last UR 04/06/2013 Social:  From home with Boyfriend.  Patient cares for her stepDTR in  the home. Morbid obesity (844 lbs)   CIR Consult:  Isabel Roberts, Barbara Godwin   Evaluation  in Progress.  (See Note in Epic) PT Recs:  CIR;Supervision - Intermittent DME Recs: Other (comment) (bariatric 3N1) Remains on IV Lasix tid.  down approximately 12 liters and needs approximatly 40 liters. Plan:  CM will refer to LTAC if CIR not an option. Crystal Hutchinson RN, BSN, [facility], [place] 04/07/2013

## 2013-04-07 NOTE — Progress Notes (Signed)
Genia Hottericole Hettinger EAV:[id number] DOB: 1980-05-21 DOA: 04/05/2013 PCP: Florentina JennyRIPP, HENRY, MD  Brief narrative: 33 year old, Body mass index is 128.43 kg/(m^2)., chronic diastolic CHF, OSA, OHS, on BiPAP, hypermenorrhea African American female admitted 04/05/13 with acute respiratory failure O2 sat 70%, nonrebreather = weaned to 4 L nasal cannula  Past medical history-As per Problem list Chart reviewed as below- reviewed  Consultants:  none  Procedures:  none  Antibiotics:  none   Subjective  Alert.  Feels better.  Hasn't needed the Bipap today Pain still+ in the L axillae with odour SOB imporved. -12 liters since admit   Objective    Interim History: none  Telemetry: nsr   Objective: Filed Vitals:   04/07/13 0721 04/07/13 0851 04/07/13 0910 04/07/13 1100  BP: 86/33  114/61   Pulse: 92  97   Temp: 98 F (36.7 C)     TempSrc: Oral     Resp: 18     Height:      Weight:    339.563 kg (748 lb 9.6 oz)  SpO2: 94% 95%      Intake/Output Summary (Last 24 hours) at 04/07/13 1632 Last data filed at 04/07/13 0918  Gross per 24 hour  Intake    613 ml  Output   4700 ml  Net  -4087 ml    Exam:  General: Body mass index is 128.43 kg/(m^2). Cardiovascular: s1 s2 no m/r/g, chest on l side=swollen, moisture under folds of l breat Respiratory: cannot meaningfully assess Abdomen: cannot assess Skin Massive Le edema Buttocks skin has no breakdown Neuro ROm intact  Data Reviewed: Basic Metabolic Panel:  Recent Labs Lab 04/05/13 1443 04/05/13 1523 04/06/13 0419  NA 135* 140 142  K 6.8* 4.6 4.5  CL 102 96 97  CO2  --  35* 37*  GLUCOSE 88 79 87  BUN 10 9 9   CREATININE 0.70 0.67 0.75  CALCIUM  --  9.1 8.9  MG  --   --  2.1   Liver Function Tests: No results found for this basename: AST, ALT, ALKPHOS, BILITOT, PROT, ALBUMIN,  in the last 168 hours No results found for this basename: LIPASE, AMYLASE,  in the last 168 hours No results found for this basename:  AMMONIA,  in the last 168 hours CBC:  Recent Labs Lab 04/05/13 1443 04/05/13 1523 04/06/13 0419  WBC  --  9.7 6.5  NEUTROABS  --  6.6  --   HGB 12.9 11.9* 11.0*  HCT 38.0 39.7 37.1  MCV  --  90.2 89.0  PLT  --  251 245   Cardiac Enzymes: No results found for this basename: CKTOTAL, CKMB, CKMBINDEX, TROPONINI,  in the last 168 hours BNP: No components found with this basename: POCBNP,  CBG: No results found for this basename: GLUCAP,  in the last 168 hours  Recent Results (from the past 240 hour(s))  URINE CULTURE     Status: None   Collection Time    04/05/13  1:20 PM      Result Value Range Status   Specimen Description URINE, CATHETERIZED   Final   Special Requests NONE   Final   Culture  Setup Time     Final   Value: 04/05/2013 18:59     Performed at [facility]   Colony Count     Final   Value: NO GROWTH     Performed at [facility]   Culture     Final   Value: NO GROWTH  Performed at [facility]   Report Status 04/06/2013 FINAL   Final     Studies:              All Imaging reviewed and is as per above notation   Scheduled Meds: . albuterol  2.5 mg Nebulization TID  . antiseptic oral rinse  15 mL Mouth Rinse BID  . clindamycin (CLEOCIN) IV  600 mg Intravenous Q8H  . ferrous sulfate  325 mg Oral TID WC  . fluconazole (DIFLUCAN) IV  400 mg Intravenous Q24H  . furosemide  40 mg Intravenous Q8H  . heparin  5,000 Units Subcutaneous Q8H  . norethindrone  5 mg Oral Daily  . polyethylene glycol  17 g Oral Daily  . sodium chloride  3 mL Intravenous Q12H  . Vitamin D (Ergocalciferol)  50,000 Units Oral Q Tue   Continuous Infusions:    Assessment/Plan: 1. Mastitis-Continue clinda 600 iv q 8.  Add Fluconazole per Pharmacy Acute diastolic HF-cannot accurately assess.  -11,914NW far.  IV lasix 40 q 8 hr for now 2. Super morbid Body mass index is 128.43 kg/(m^2).-Life threatening.  Scared of bariatric surgery as a close friend died  with this. 3. Vaginal bleed-Menstrual bleeding normal.  Cont Fe SO4  Code Status: Full Family Communication: None present Disposition Plan: CIR? Pending insurance approval.  CM requested to also please look at [facility].  Pleas Koch, MD  [facility] Pager [phone] 04/07/2013, 4:32 PM    LOS: 2 days

## 2013-04-07 NOTE — Progress Notes (Signed)
Patient alert and oriented x4; states she is comfortable and has no questions or concerns at this time.  Patient worked with PT this morning, walked in room and into hall with front wheel walker.  Patient is currently menstruating; Foley catheter care performed this morning.  Will continue to monitor.

## 2013-04-07 NOTE — Consult Note (Signed)
Physical Medicine and Rehabilitation Consult  Reason for Consult:  SOB and mastitis affecting mobility in morbidly obese female Referring Physician:  Dr. Mahala MenghiniSamtani   HPI: Isabel Roberts is a 33 y.o. female with history of OSA, morbid obesity (844 lbs) admitted on 04/05/13 with complaints of 3 week history of redness and pain left breast as well as SOB due to acute respiratory failure due to fluid overload. She was treated with diuretics and placed on NRB mask with improvement. Mastitis treated with clindamycin and fluconazole. PT/OT evaluations done and CIR recommended by therapy team due to decline in functional ability.    Review of Systems  Eyes: Negative for blurred vision and double vision.  Respiratory: Positive for shortness of breath.   Cardiovascular: Negative for chest pain and palpitations.  Gastrointestinal: Negative for heartburn and abdominal pain.  Musculoskeletal: Positive for neck pain.  Neurological: Positive for dizziness (improving since admission) and headaches.    Past Medical History  Diagnosis Date  . Sleep apnea   . Pneumonia   . Obesity   . Vaginal bleeding   . Pneumonia    Past Surgical History  Procedure Laterality Date  . Vagina surgery  09/2011   Family History  Problem Relation Age of Onset  . Diabetes Mother   . Hypertension Mother   . Hypertension Father   . Diabetes Father    Social History:    Lives with boyfriend--able to ambulate household distances with RW. She has smoked in the distant past. She has never used smokeless tobacco. She reports that she drinks alcohol on occasion. She reports that she does not use illicit drugs.   Allergies  Allergen Reactions  . Fish Allergy Hives and Other (See Comments)    Throat swells  . Other Other (See Comments)    Grass causes a rash  . Shrimp [Shellfish Allergy] Rash   Medications Prior to Admission  Medication Sig Dispense Refill  . aspirin-acetaminophen-caffeine (EXCEDRIN MIGRAINE) 250-250-65  MG per tablet Take 2 tablets by mouth every 6 (six) hours as needed for headache.      . ferrous sulfate 325 (65 FE) MG tablet Take 325 mg by mouth 3 (three) times daily with meals.      . furosemide (LASIX) 40 MG tablet Take 1 tablet (40 mg total) by mouth daily.  30 tablet  0  . norethindrone (AYGESTIN) 5 MG tablet Take 5 mg by mouth daily.      . Vitamin D, Ergocalciferol, (DRISDOL) 50000 UNITS CAPS capsule Take 50,000 Units by mouth every 7 (seven) days. Tuesdays      . VITAMIN E PO Take 1 capsule by mouth daily.      Marland Kitchen. albuterol (PROVENTIL HFA;VENTOLIN HFA) 108 (90 BASE) MCG/ACT inhaler Inhale 2 puffs into the lungs daily as needed for wheezing or shortness of breath.        Home: Home Living Family/patient expects to be discharged to:: Private residence Living Arrangements: Spouse/significant other Available Help at Discharge: Family;Available PRN/intermittently Type of Home: House Home Access: Level entry Home Layout: One level Home Equipment: Walker - 2 wheels;Shower seat  Functional History: Prior Function Comments: Boyfriend helps pt get out of shower Functional Status:  Mobility:     Ambulation/Gait Ambulation Distance (Feet): 2 Feet Gait velocity: decr General Gait Details: Took a few steps forward and a few back only.     ADL: ADL Grooming: Performed;Wash/dry hands;Wash/dry face Where Assessed - Grooming: Supine, head of bed up Upper Body Bathing: Simulated;Minimal assistance Where Assessed -  Upper Body Bathing: Supine, head of bed up Lower Body Bathing: +1 Total assistance Upper Body Dressing: Performed;Minimal assistance Where Assessed - Upper Body Dressing: Supine, head of bed up Transfers/Ambulation Related to ADLs: transfers not assessed as pt requires +2-3 assist. Per PT, pt is Total A +2 for bed mobility to sit EOB and total A +2 min A for sit - stand/transfers ADL Comments: LB ADLs not assessed, pt supine with HOB wlwvtaed and pt requires +2-3 to sit  EOB  Cognition: Cognition Overall Cognitive Status: Within Functional Limits for tasks assessed Cognition Arousal/Alertness: Awake/alert Behavior During Therapy: WFL for tasks assessed/performed Overall Cognitive Status: Within Functional Limits for tasks assessed  Blood pressure 86/33, pulse 92, temperature 98 F (36.7 C), temperature source Oral, resp. rate 18, height 5\' 4"  (1.626 m), weight 382.881 kg (844 lb 1.6 oz), last menstrual period 03/25/2013, SpO2 94.00%. Physical Exam  Constitutional: She is oriented to person, place, and time.  Morbidly obese female.   HENT:  Head: Normocephalic and atraumatic.  Eyes: Conjunctivae are normal. Pupils are equal, round, and reactive to light.  Neck: Normal range of motion. Neck supple.  Cardiovascular: Normal rate.   Respiratory: Effort normal. No respiratory distress. She has decreased breath sounds. She has no wheezes.  distant sounds due to body habitus.   GI: Soft. Bowel sounds are normal. She exhibits distension. There is no tenderness. There is no rebound.  Musculoskeletal: She exhibits edema.  Moves BUE without difficulty. Able to slide LLE in bed but has difficulty mobilizing RLE.   Neurological: She is alert and oriented to person, place, and time. No cranial nerve deficit. Coordination normal.  Decreased LT sensation in distal LE's.   Psychiatric:  Pleasant, cooperative    No results found for this or any previous visit (from the past 24 hour(s)). Dg Chest Port 1 View  04/05/2013   CLINICAL DATA:  Shortness of breath  EXAM: PORTABLE CHEST - 1 VIEW  COMPARISON:  12/16/2012  FINDINGS: Cardiac shadow is enlarged. Pulmonary vascular congestion is again noted similar to that seen on the prior exam. No focal infiltrate or acute bony abnormality is noted.  IMPRESSION: Congestive failure.   Electronically Signed   By: Alcide Clever M.D.   On: 04/05/2013 15:13    Assessment/Plan: Diagnosis: Deconditioned morbidly obese female after  congestive heart failure/fluid overload 1. Does the need for close, 24 hr/day medical supervision in concert with the patient's rehab needs make it unreasonable for this patient to be served in a less intensive setting? Potentially 2. Co-Morbidities requiring supervision/potential complications: OSA, mastitis, 3. Due to bladder management, bowel management, safety, skin/wound care, disease management, medication administration, pain management and patient education, does the patient require 24 hr/day rehab nursing? Yes and Potentially 4. Does the patient require coordinated care of a physician, rehab nurse, PT (1-2 hrs/day, 5 days/week) and OT (1-2 hrs/day, 5 days/week) to address physical and functional deficits in the context of the above medical diagnosis(es)? Yes Addressing deficits in the following areas: balance, endurance, locomotion, strength, transferring, bowel/bladder control, bathing, dressing, feeding, grooming, toileting and psychosocial support 5. Can the patient actively participate in an intensive therapy program of at least 3 hrs of therapy per day at least 5 days per week? Potentially 6. The potential for patient to make measurable gains while on inpatient rehab is good and fair 7. Anticipated functional outcomes upon discharge from inpatient rehab are supervision to mod I with PT, supervision to mod I with OT, n/a with SLP. 8.  Estimated rehab length of stay to reach the above functional goals is: ?TBD (see below) 9. Does the patient have adequate social supports to accommodate these discharge functional goals? Potentially 10. Anticipated D/C setting: Home 11. Anticipated post D/C treatments: HH therapy 12. Overall Rehab/Functional Prognosis: good  RECOMMENDATIONS: This patient's condition is appropriate for continued rehabilitative care in the following setting: CIR vs Home health Patient has agreed to participate in recommended program. Yes and Potentially Note that insurance  prior authorization may be required for reimbursement for recommended care.  Comment: She has only been here since Sunday. She is sedentary at baseline. Follow for further progress tomorrow. Consider brief inpatient rehab stay if she's not progressing.  Ranelle Oyster, MD, [facility] Physical Medicine & Rehabilitation     04/07/2013

## 2013-04-07 NOTE — Progress Notes (Signed)
Noted IV Lasix tid  with 12 liters diuresed since admission. I will meet with her tomorrow and follow her progress to assist in determining if brief inpt rehab admission needed prior to d/c home. [phone]

## 2013-04-07 NOTE — Progress Notes (Signed)
Physical Therapy Treatment Patient Details Name: Genia Hottericole Gugliotta MRN: [id number] DOB: 04-15-1980 Today's Date: 04/07/2013 Time: [id number] PT Time Calculation (min): 84 min  PT Assessment / Plan / Recommendation  History of Present Illness Pt admit with CHF and SOB.   PT Comments   Pt admitted with above. Pt currently with functional limitations due to balance and endurance deficits as well as strength deficits.  Pt will benefit from skilled PT to increase their independence and safety with mobility to allow discharge to the venue listed below. Pt very motivated and works really hard!!  Follow Up Recommendations  CIR;Supervision - Intermittent                 Equipment Recommendations  Other (comment) (bariatric 3N1)    Recommendations for Other Services Rehab consult  Frequency Min 3X/week   Progress towards PT Goals Progress towards PT goals: Progressing toward goals  Plan Current plan remains appropriate    Precautions / Restrictions Precautions Precautions: Fall Precaution Comments: Bariatric Restrictions Weight Bearing Restrictions: No   Pertinent Vitals/Pain VSS - DOE 3/4 with 4LO2  sats >90% throughout. Right foot pain.    Mobility  Bed Mobility Overal bed mobility: Needs Assistance Bed Mobility: Rolling Rolling: Mod assist;+2 for physical assistance (+3 for ultimate safety) Supine to sit: Min assist;+2 for safety/equipment;HOB elevated Sit to supine: Mod assist;+2 for physical assistance General bed mobility comments: During session, pt got OOB, ambulated to 3N1 5 feet away, used 3N1, ambulated to bed to be cleaned as she rolls on her side and uses adaptive equipment to clean herself at home, then she got up again and walked x3 in hall and then got back to bed at end of treatment.  Pt uses momentum to roll L to R, R to L.  Required total assist with LEs.   Transfers Overall transfer level: Needs assistance Equipment used: Rolling walker (2 wheeled) Transfers: Sit  to/from UGI CorporationStand;Stand Pivot Transfers Sit to Stand: +2 physical assistance;+2 safety/equipment;From elevated surface;Min assist Stand pivot transfers: +2 physical assistance;Min assist;From elevated surface;+2 safety/equipment General transfer comment: Pt +3 assist for safety.  Pt widens BOS and uses momentum to perform sit to stand.  Pt using RW to stabilize with multiple sit to stand attempts as pt went from bed to 3N1 to ambulating x3 and resting in wheelchair between walks.   Ambulation/Gait Ambulation/Gait assistance: +2 physical assistance;Min assist;+2 safety/equipment (+3 for ultimate safety) Ambulation Distance (Feet): 34 Feet (5 feet, 8 feet, 16 feet and 5 feet) Assistive device: Rolling walker (2 wheeled) Gait Pattern/deviations: Step-to pattern;Decreased stride length;Decreased step length - right;Decreased step length - left;Trunk flexed;Wide base of support Gait velocity: decr Gait velocity interpretation: <1.8 ft/sec, indicative of risk for recurrent falls General Gait Details: Pt pushes RW out in front as she does not fit inside RW.  Pt moves quickly needing +3 assist for safety.  Pt followed with chair.  Doesn't need a lot of physical assist as pt able to maintain postural stability.  Takes standing rest breaks.  Fatigues quickly and needs cues for monitoring how far to progress at a time.       PT Goals (current goals can now be found in the care plan section)    Visit Information  Last PT Received On: 04/07/13 Assistance Needed: +3 or more History of Present Illness: Pt admit with CHF and SOB.    Subjective Data  Subjective: "I am just overwhelmed."   Cognition  Cognition Arousal/Alertness: Awake/alert Behavior During Therapy: WFL for tasks  assessed/performed Overall Cognitive Status: Within Functional Limits for tasks assessed    Balance  Balance Overall balance assessment: Needs assistance;History of Falls Sitting-balance support: Bilateral upper extremity  supported;Feet supported Sitting balance-Leahy Scale: Good Standing balance support: Bilateral upper extremity supported;During functional activity Standing balance-Leahy Scale: Fair General Comments General comments (skin integrity, edema, etc.): Assisted pt with cleaning bottom after she had BM and noted some excoriated area between legs.  Nursing aware.    End of Session PT - End of Session Equipment Utilized During Treatment: Gait belt;Oxygen Activity Tolerance: Patient limited by fatigue Patient left: in bed;with call bell/phone within reach Nurse Communication: Mobility status        INGOLD,Marchetta Navratil 04/07/2013, 12:55 PM [facility] [phone] (pager)

## 2013-04-08 MED ORDER — FUROSEMIDE 40 MG PO TABS
60.0000 mg | ORAL_TABLET | Freq: Two times a day (BID) | ORAL | Status: DC
  Administered 2013-04-09 – 2013-04-10 (×4): 60 mg via ORAL
  Filled 2013-04-08 (×5): qty 1

## 2013-04-08 MED ORDER — DOXYCYCLINE HYCLATE 100 MG PO TABS
100.0000 mg | ORAL_TABLET | Freq: Two times a day (BID) | ORAL | Status: DC
  Administered 2013-04-09 – 2013-04-10 (×3): 100 mg via ORAL
  Filled 2013-04-08 (×4): qty 1

## 2013-04-08 NOTE — Progress Notes (Addendum)
[facility] PROGRESS NOTE  Isabel Roberts ZOX:[id number] DOB: [birth year] DOA: 04/05/2013 PCP: [name], MD  Assessment/Plan: 1-acute resp failure due acute on chronic diastolic heart failure: -imagine patient OHS and OSA also playing role in her SOB -continue IV diuresis one more day -switch to lasix 60mg  BID on 1/15 -continue low sodium diet -close follow up to weight and fluid intake -BMET in am   2-Morbid obesity: extensive discussion about low calorie diet and weight loss. -patient understand life threatening situation of her obesity and is looking to make lifestyle changes -considering after discussion bariatric surgery in the future  3-OSA (obstructive sleep apnea): continue QHS CPAP  4-Bilateral lower extremity edema and anasarca: due to #1.improving. -continue diuretics  5-Mastitis, left, acute: improving. No fever -will switch to doxycycline PO to finish tx -continue diflucan  6-anemia: iron deficiency due to menstruation Continue ferrous sulfate.   Code Status: Full Family Communication: no family at bedside Disposition Plan: potentially CIR vs LTAC vs home with [facility] services   Consultants:  None   Procedures:  See below for x-ray reports  Antibiotics:  Clindamycin  Diflucan   HPI/Subjective: Patient continue having improvement in resp failure and continue massive diuresis.  Objective: Filed Vitals:   04/08/13 2101  BP:   Pulse: 90  Temp:   Resp: 18    Intake/Output Summary (Last 24 hours) at 04/08/13 2246 Last data filed at 04/08/13 2150  Gross per 24 hour  Intake    753 ml  Output   7076 ml  Net  -6323 ml   Filed Weights   04/07/13 1100 04/08/13 1419  Weight: 339.563 kg (748 lb 9.6 oz) 334.981 kg (738 lb 8 oz)    Exam:   General:  Feeling better and breathing more comfortable. Afebrile and no CP  Cardiovascular: S1 and S2, no rubs or gallops  Respiratory: distant BS, no wheezing and no crackles  Abdomen: obese, no  tenderness, positive BS  Musculoskeletal: no specific joint swelling; limited exam due to body habitus. 2-3++ LE edema  Data Reviewed: Basic Metabolic Panel:  Recent Labs Lab 04/05/13 1443 04/05/13 1523 04/06/13 0419  NA 135* 140 142  K 6.8* 4.6 4.5  CL 102 96 97  CO2  --  35* 37*  GLUCOSE 88 79 87  BUN 10 9 9   CREATININE 0.70 0.67 0.75  CALCIUM  --  9.1 8.9  MG  --   --  2.1   CBC:  Recent Labs Lab 04/05/13 1443 04/05/13 1523 04/06/13 0419  WBC  --  9.7 6.5  NEUTROABS  --  6.6  --   HGB 12.9 11.9* 11.0*  HCT 38.0 39.7 37.1  MCV  --  90.2 89.0  PLT  --  251 245   BNP (last 3 results)  Recent Labs  12/08/12 0005 12/16/12 1654 04/05/13 1523  PROBNP 193.8* 41.4 10.6   CBG: No results found for this basename: GLUCAP,  in the last 168 hours  Recent Results (from the past 240 hour(s))  URINE CULTURE     Status: None   Collection Time    04/05/13  1:20 PM      Result Value Range Status   Specimen Description URINE, CATHETERIZED   Final   Special Requests NONE   Final   Culture  Setup Time     Final   Value: 04/05/2013 18:59     Performed at [facility] Count     Final   Value: NO  GROWTH     [facility]formed at [facility]   Culture     Final   Value: NO GROWTH     Performed at [facility]   Report Status 04/06/2013 FINAL   Final     Studies: No results found.  Scheduled Meds: . albuterol  2.5 mg Nebulization TID  . antiseptic oral rinse  15 mL Mouth Rinse BID  . [START ON 04/09/2013] doxycycline  100 mg Oral Q12H  . ferrous sulfate  325 mg Oral TID WC  . fluconazole (DIFLUCAN) IV  400 mg Intravenous Q24H  . furosemide  40 mg Intravenous Q8H  . heparin  5,000 Units Subcutaneous Q8H  . norethindrone  5 mg Oral Daily  . polyethylene glycol  17 g Oral Daily  . sodium chloride  3 mL Intravenous Q12H  . Vitamin D (Ergocalciferol)  50,000 Units Oral Q Tue     Time spent: >35 minutes (> 50% of time discussing care and  plan with patient on face to face encounter)    Gwendelyn Lanting  [facility] Pager [phone]. If 7PM-7AM, please contact night-coverage at [email or web address], password [email or web address] 04/08/2013, 10:46 PM  LOS: 3 days

## 2013-04-08 NOTE — ED Provider Notes (Signed)
Medical screening examination/treatment/procedure(s) were performed by non-physician practitioner and as supervising physician I was immediately available for consultation/collaboration.  EKG Interpretation    Date/Time:  Sunday April 05 2013 12:33:15 EST Ventricular Rate:  83 PR Interval:  170 QRS Duration: 80 QT Interval:  374 QTC Calculation: 439 R Axis:   91 Text Interpretation:  Sinus rhythm Borderline right axis deviation Low voltage, precordial leads ED PHYSICIAN INTERPRETATION AVAILABLE IN [facility] Confirmed by TEST, RECORD ([id number]) on 04/07/2013 9:31:45 AM            Morbid obesity, pulmonary congestion, multiple comorbidities.  Cellulitis.  Admit to medicine.    Darlys Galesavid Masneri, MD 04/08/13 [phone]

## 2013-04-08 NOTE — Progress Notes (Signed)
Pt stated was off telemetry since early afternoon.Telemetry resumed after no d/c order noted. Pt NSR. No c/o chest pains nor SOB. Continued to monitor.

## 2013-04-08 NOTE — Progress Notes (Signed)
Physical Therapy Treatment Patient Details Name: Isabel Roberts MRN: [id number] DOB: [birth year] Today's Date: 04/08/2013 Time: 1310-1410 PT Time Calculation (min): 60 min  PT Assessment / Plan / Recommendation  History of Present Illness Pt admit with CHF and SOB.   PT Comments   Pt able to perform rolling for hygiene with supervision, requires min A for safety with transfers, supervision with gait.  Pt needs +2 for safety and equipment management, only able to gait short distances due to fatigue.  Pt needs continued PT services for activity tolerance and mobility.  Follow Up Recommendations  Supervision - Intermittent;CIR     Does the patient have the potential to tolerate intense rehabilitation     Barriers to Discharge        Equipment Recommendations   (bariatric 3 in 1)    Recommendations for Other Services    Frequency Min 3X/week   Progress towards PT Goals Progress towards PT goals: Progressing toward goals  Plan Current plan remains appropriate    Precautions / Restrictions Precautions Precautions: Fall Precaution Comments: Bariatric Restrictions Weight Bearing Restrictions: No   Pertinent Vitals/Pain No c/o pain    Mobility  Bed Mobility Rolling: Supervision Supine to sit: +2 for safety/equipment;Min assist Sit to supine: Min assist;+2 for safety/equipment General bed mobility comments: assist for LEs into/out of bed. pt able to roll with bed rails wihtout physical assistance.  pt needs +2 for oxygen, catheter Transfers Equipment used: Rolling walker (2 wheeled) Sit to Stand: Supervision;+2 safety/equipment Stand pivot transfers: Supervision;+2 safety/equipment General transfer comment: wide BOS, uses momentum, needs cues for safety Ambulation/Gait Ambulation/Gait assistance: +2 safety/equipment;Supervision Ambulation Distance (Feet): 15 Feet (pt able to gait 15' x 2, 20') Assistive device: Rolling walker (2 wheeled) General Gait Details: pt requries close  follow with w/c with pillow to increase height of w/c.  fatigues quickly, no LOB    Exercises     PT Diagnosis:    PT Problem List:   PT Treatment Interventions:     PT Goals (current goals can now be found in the care plan section)    Visit Information  Last PT Received On: 04/08/13 Assistance Needed: +3 or more History of Present Illness: Pt admit with CHF and SOB.    Subjective Data      Cognition  Cognition Arousal/Alertness: Awake/alert Behavior During Therapy: WFL for tasks assessed/performed Overall Cognitive Status: Within Functional Limits for tasks assessed    Balance     End of Session PT - End of Session Equipment Utilized During Treatment: Oxygen Activity Tolerance: Patient limited by fatigue Patient left: in bed;with call bell/phone within reach;with nursing/sitter in room Nurse Communication: Mobility status   GP     Isabel Roberts 04/08/2013, 2:25 PM

## 2013-04-09 LAB — BASIC METABOLIC PANEL
BUN: 11 mg/dL (ref 6–23)
CO2: 34 mEq/L — ABNORMAL HIGH (ref 19–32)
Calcium: 9.5 mg/dL (ref 8.4–10.5)
Chloride: 93 mEq/L — ABNORMAL LOW (ref 96–112)
Creatinine, Ser: 0.77 mg/dL (ref 0.50–1.10)
GFR calc Af Amer: >90 mL/min (ref >90)
GFR calc non Af Amer: >90 mL/min (ref >90)
Glucose, Bld: 98 mg/dL (ref 70–99)
POTASSIUM: 4.3 meq/L (ref 3.7–5.3)
Sodium: 138 mEq/L (ref 137–147)

## 2013-04-09 MED ORDER — FLUCONAZOLE 200 MG PO TABS
400.0000 mg | ORAL_TABLET | Freq: Every day | ORAL | Status: DC
  Administered 2013-04-09 – 2013-04-10 (×2): 400 mg via ORAL
  Filled 2013-04-09 (×2): qty 2

## 2013-04-09 NOTE — Progress Notes (Signed)
Patient making good progress with therapy. No inpt rehab bed is available today. Discussed with Dr. Gwenlyn PerkingMadera. [phone]

## 2013-04-09 NOTE — Progress Notes (Signed)
[facility] PROGRESS NOTE  Isabel Roberts BJY:[id number] DOB: 1981-02-06 DOA: 04/05/2013 PCP: [name], MD  Assessment/Plan: 1-acute resp failure due acute on chronic diastolic heart failure: -imagine patient OHS and OSA also playing role in her SOB -switch to lasix 60mg  BID today (1/15) -continue low sodium diet -close follow up to weight and fluid intake -BMET in am   2-Morbid obesity: extensive discussion about low calorie diet and weight loss. -patient understand life threatening situation of her obesity and is looking to make lifestyle changes -patient contemplating after discussion bariatric surgery in the future  3-OSA (obstructive sleep apnea): continue QHS CPAP  4-Bilateral lower extremity edema and anasarca: due to #1. Slowly improving. -continue diuretics, now by mouth  5-Mastitis, left, acute: improving/resolving. No fever -will switch to doxycycline PO to finish tx -continue diflucan also PO now  6-anemia: iron deficiency due to menstruation Continue ferrous sulfate.  7-physical deconditioning: continue PT as frequent as possible while inpatient. CIR wating bed availability for possible acute rehab prior to discharge. If unable to do CIR will arrange for [facility] services.   Code Status: Full Family Communication: no family at bedside Disposition Plan: potentially CIR (currently no beds available) vs home with [facility] services   Consultants:  None   Procedures:  See below for x-ray reports  Antibiotics:  doxycyclin  Diflucan   HPI/Subjective: Patient continue having improvement in resp failure. Denies CP, abd pain, nausea or vomiting.  Objective: Filed Vitals:   04/09/13 2129  BP:   Pulse: 88  Temp:   Resp: 18    Intake/Output Summary (Last 24 hours) at 04/09/13 2228 Last data filed at 04/09/13 2200  Gross per 24 hour  Intake    240 ml  Output   6750 ml  Net  -6510 ml   Filed Weights   04/07/13 1100 04/08/13 1419 04/09/13 0635  Weight:  339.563 kg (748 lb 9.6 oz) 334.981 kg (738 lb 8 oz) 330.854 kg (729 lb 6.4 oz)    Exam:   General:  Feeling better and breathing more comfortable. Afebrile and no CP  Cardiovascular: S1 and S2, no rubs or gallops  Respiratory: distant BS, no wheezing and no crackles  Abdomen: obese, no tenderness, positive BS  Musculoskeletal: no specific joint swelling; limited exam due to body habitus. 2-3++ LE edema  Data Reviewed: Basic Metabolic Panel:  Recent Labs Lab 04/05/13 1443 04/05/13 1523 04/06/13 0419 04/09/13 0525  NA 135* 140 142 138  K 6.8* 4.6 4.5 4.3  CL 102 96 97 93*  CO2  --  35* 37* 34*  GLUCOSE 88 79 87 98  BUN 10 9 9 11   CREATININE 0.70 0.67 0.75 0.77  CALCIUM  --  9.1 8.9 9.5  MG  --   --  2.1  --    CBC:  Recent Labs Lab 04/05/13 1443 04/05/13 1523 04/06/13 0419  WBC  --  9.7 6.5  NEUTROABS  --  6.6  --   HGB 12.9 11.9* 11.0*  HCT 38.0 39.7 37.1  MCV  --  90.2 89.0  PLT  --  251 245   BNP (last 3 results)  Recent Labs  12/08/12 0005 12/16/12 1654 04/05/13 1523  PROBNP 193.8* 41.4 10.6   CBG: No results found for this basename: GLUCAP,  in the last 168 hours  Recent Results (from the past 240 hour(s))  URINE CULTURE     Status: None   Collection Time    04/05/13  1:20 PM  Result Value Range Status   Specimen Description URINE, CATHETERIZED   Final   Special Requests NONE   Final   Culture  Setup Time     Final   Value: 04/05/2013 18:59     Performed at [facility]   [place] Count     Final   Value: NO GROWTH     Performed at [facility]   Culture     Final   Value: NO GROWTH     Performed at [facility]   Report Status 04/06/2013 FINAL   Final     Studies: No results found.  Scheduled Meds: . albuterol  2.5 mg Nebulization TID  . antiseptic oral rinse  15 mL Mouth Rinse BID  . doxycycline  100 mg Oral Q12H  . ferrous sulfate  325 mg Oral TID WC  . fluconazole  400 mg Oral q1800  . furosemide   60 mg Oral BID  . heparin  5,000 Units Subcutaneous Q8H  . norethindrone  5 mg Oral Daily  . polyethylene glycol  17 g Oral Daily  . sodium chloride  3 mL Intravenous Q12H  . Vitamin D (Ergocalciferol)  50,000 Units Oral Q Tue     Time spent: < 35 minutes (> 50% of time discussing care and plan with patient on face to face encounter)    Isabel Roberts  [facility] Pager [phone]. If 7PM-7AM, please contact night-coverage at [email or web address], password [email or web address] 04/09/2013, 10:28 PM  LOS: 4 days

## 2013-04-09 NOTE — Progress Notes (Signed)
Pt O3x, lying in bed w/ cpap on. On complaints of pain or sob. Will continue to monitor

## 2013-04-10 LAB — BASIC METABOLIC PANEL
BUN: 12 mg/dL (ref 6–23)
CHLORIDE: 92 meq/L — AB (ref 96–112)
CO2: 34 mEq/L — ABNORMAL HIGH (ref 19–32)
Calcium: 9.5 mg/dL (ref 8.4–10.5)
Creatinine, Ser: 0.83 mg/dL (ref 0.50–1.10)
GFR calc non Af Amer: >90 mL/min (ref >90)
Glucose, Bld: 95 mg/dL (ref 70–99)
Potassium: 4 mEq/L (ref 3.7–5.3)
Sodium: 139 mEq/L (ref 137–147)

## 2013-04-10 LAB — CBC
HEMATOCRIT: 39.9 % (ref 36.0–46.0)
Hemoglobin: 12.6 g/dL (ref 12.0–15.0)
MCH: 27.6 pg (ref 26.0–34.0)
MCHC: 31.6 g/dL (ref 30.0–36.0)
MCV: 87.5 fL (ref 78.0–100.0)
Platelets: 298 10*3/uL (ref 150–400)
RBC: 4.56 MIL/uL (ref 3.87–5.11)
RDW: 15.3 % (ref 11.5–15.5)
WBC: 10.1 10*3/uL (ref 4.0–10.5)

## 2013-04-10 MED ORDER — FLUCONAZOLE 200 MG PO TABS: 400.0000 mg | ORAL_TABLET | Freq: Every day | ORAL | Status: DC

## 2013-04-10 MED ORDER — DOXYCYCLINE HYCLATE 100 MG PO TABS
100.0000 mg | ORAL_TABLET | Freq: Two times a day (BID) | ORAL | Status: AC
Start: 2013-04-10 — End: 2013-04-15

## 2013-04-10 MED ORDER — FUROSEMIDE 40 MG PO TABS: 60.0000 mg | ORAL_TABLET | Freq: Two times a day (BID) | ORAL | Status: DC

## 2013-04-10 NOTE — Discharge Summary (Signed)
Physician Discharge Summary  Isabel Roberts ZOX:[id number] DOB: [birth year] DOA: 04/05/2013  PCP: [name], MD  Admit date: 04/05/2013 Discharge date: 04/10/2013  Time spent: >30 minutes  Recommendations for Outpatient Follow-up:  1. BMET to follow electrolytes and renal function 2. Arrange outpatient sleep study to adjust CPAP pressure water  Discharge Diagnoses:  Active Problems:   Morbid obesity   OSA (obstructive sleep apnea)   Bilateral lower extremity edema   Acute respiratory failure   Acute on chronic diastolic CHF (congestive heart failure)   Mastitis, left, acute   Pulmonary edema   Acute diastolic CHF (congestive heart failure), NYHA class 3   Discharge Condition: stable and improved. Discharge home with home health services.  Diet recommendation: low calorie and low sodium  Filed Weights   04/08/13 1419 04/09/13 0635 04/10/13 0444  Weight: 334.981 kg (738 lb 8 oz) 330.854 kg (729 lb 6.4 oz) 330.63 kg (728 lb 14.5 oz)    History of present illness:  33 y.o.African American female with super obesity, history of chronic diastolic CHF, OSA/hypoventilation syndrome, she is on CPAP at home. Patient said for the past 3 weeks she started to have redness and pain in her left breast, in the same time she started to have shortness of breath, swelling in her legs and cough. Patient came in to the ED because of worsening of her symptoms. Patient also mentioned that she had vaginal bleeding for 3 days and now to stop, she thinks this is might be her period.  In the ED initial oxygen saturation was 70% on room air, patient was placed on rebreather mask and was successful to wean off to 4 L of oxygen via nasal cannula.   Hospital Course:  1-acute resp failure due acute on chronic diastolic heart failure:  -patient OHS and OSA also playing role in her SOB  -discharge home with lasix 60mg  BID -continue low sodium diet   -BMET as an outpatient -will need sleep study as an  outpatient to adjust CPAP pressure water  2-Morbid obesity: extensive discussion about low calorie diet and weight loss.  -patient understand life threatening situation of her obesity and is looking to make lifestyle changes  -patient contemplating after discussion bariatric surgery in the future   3-OSA (obstructive sleep apnea): continue QHS CPAP   4-Bilateral lower extremity edema and anasarca: due to #1. -Slowly improving.  -continue diuretics, now by mouth  -patient advise to follow a low sodium diet  5-Mastitis, left, acute: improving/resolving. No fever  -patient will continue doxycycline and diflucan PO to complete treatment as instructed  6-anemia: iron deficiency due to menstruation  Continue ferrous sulfate.  -Hgb stable.  7-physical deconditioning: due to obesity mainly. -no SNF or beds in CIR/LTAC available -HH services arranged and patient discharge home with family and significant other care (boyfriend).   Procedures: See below for x-ray reports  Consultations:  CIR  Physical therapy  Discharge Exam: Filed Vitals:   04/10/13 1417  BP: 118/43  Pulse: 87  Temp: 98.2 F (36.8 C)  Resp: 20   General: Feeling better and breathing more comfortable. Afebrile and no CP  Cardiovascular: S1 and S2, no rubs or gallops  Respiratory: distant BS, no wheezing and no crackles  Abdomen: obese, no tenderness, positive BS Musculoskeletal: no specific joint swelling; limited exam due to body habitus. 2-3++ LE edema (lympedema)   Discharge Instructions  Discharge Orders   Future Orders Complete By Expires   Diet - low sodium heart healthy  As  directed    Discharge instructions  As directed    Comments:     Take medications as prescribed Arrange follow up with PCP in 1 week Follow a low calorie diet and low sodium diet (less than 2 G daily)       Medication List         albuterol 108 (90 BASE) MCG/ACT inhaler  Commonly known as:  PROVENTIL HFA;VENTOLIN HFA   Inhale 2 puffs into the lungs daily as needed for wheezing or shortness of breath.     aspirin-acetaminophen-caffeine 250-250-65 MG per tablet  Commonly known as:  EXCEDRIN MIGRAINE  Take 2 tablets by mouth every 6 (six) hours as needed for headache.     doxycycline 100 MG tablet  Commonly known as:  VIBRA-TABS  Take 1 tablet (100 mg total) by mouth every 12 (twelve) hours.     ferrous sulfate 325 (65 FE) MG tablet  Take 325 mg by mouth 3 (three) times daily with meals.     fluconazole 200 MG tablet  Commonly known as:  DIFLUCAN  Take 2 tablets (400 mg total) by mouth daily at 6 PM.     furosemide 40 MG tablet  Commonly known as:  LASIX  Take 1.5 tablets (60 mg total) by mouth 2 (two) times daily.     norethindrone 5 MG tablet  Commonly known as:  AYGESTIN  Take 5 mg by mouth daily.     Vitamin D (Ergocalciferol) 50000 UNITS Caps capsule  Commonly known as:  DRISDOL  Take 50,000 Units by mouth every 7 (seven) days. Tuesdays     VITAMIN E PO  Take 1 capsule by mouth daily.       Allergies  Allergen Reactions  . Fish Allergy Hives and Other (See Comments)    Throat swells  . Other Other (See Comments)    Grass causes a rash  . Shrimp [Shellfish Allergy] Rash       Follow-up Information   Follow up with [name], MD In 1 week.   Specialty:  Family Medicine   Contact information:   [address] [place] [address] [phone]       The results of significant diagnostics from this hospitalization (including imaging, microbiology, ancillary and laboratory) are listed below for reference.    Significant Diagnostic Studies: Dg Chest Port 1 View  04/05/2013   CLINICAL DATA:  Shortness of breath  EXAM: PORTABLE CHEST - 1 VIEW  COMPARISON:  12/16/2012  FINDINGS: Cardiac shadow is enlarged. Pulmonary vascular congestion is again noted similar to that seen on the prior exam. No focal infiltrate or acute bony abnormality is noted.  IMPRESSION:  Congestive failure.   Electronically Signed   By: Alcide Clever M.D.   On: 04/05/2013 15:13    Microbiology: Recent Results (from the past 240 hour(s))  URINE CULTURE     Status: None   Collection Time    04/05/13  1:20 PM      Result Value Range Status   Specimen Description URINE, CATHETERIZED   Final   Special Requests NONE   Final   Culture  Setup Time     Final   Value: 04/05/2013 18:59     Performed at [facility] Count     Final   Value: NO GROWTH     Performed at [facility]   Culture     Final   Value: NO GROWTH  Performed at [facility]   Report Status 04/06/2013 FINAL   Final     Labs: Basic Metabolic Panel:  Recent Labs Lab 04/05/13 1443 04/05/13 1523 04/06/13 0419 04/09/13 0525 04/10/13 0513  NA 135* 140 142 138 139  K 6.8* 4.6 4.5 4.3 4.0  CL 102 96 97 93* 92*  CO2  --  35* 37* 34* 34*  GLUCOSE 88 79 87 98 95  BUN 10 9 9 11 12   CREATININE 0.70 0.67 0.75 0.77 0.83  CALCIUM  --  9.1 8.9 9.5 9.5  MG  --   --  2.1  --   --    CBC:  Recent Labs Lab 04/05/13 1443 04/05/13 1523 04/06/13 0419 04/10/13 1545  WBC  --  9.7 6.5 PENDING  NEUTROABS  --  6.6  --   --   HGB 12.9 11.9* 11.0* 12.6  HCT 38.0 39.7 37.1 39.9  MCV  --  90.2 89.0 87.5  PLT  --  251 245 298   BNP: BNP (last 3 results)  Recent Labs  12/08/12 0005 12/16/12 1654 04/05/13 1523  PROBNP 193.8* 41.4 10.6    Signed:  Nalea Salce  [facility] 04/10/2013, 4:32 PM

## 2013-04-10 NOTE — Progress Notes (Signed)
I am unable to admit pt at this time and current level of functioning feel  Pt close to her baseline. She is not independent enough though to care for a small child. Discussed with RN CM. [phone]

## 2013-04-10 NOTE — Progress Notes (Signed)
Physical Therapy Treatment Patient Details Name: Isabel Roberts MRN: [id number] DOB: 03-20-1981 Today's Date: 04/10/2013 Time: 4098-1191 PT Time Calculation (min): 39 min  PT Assessment / Plan / Recommendation  History of Present Illness Pt admit with CHF and SOB.   PT Comments   Pt with improved bed mobility today, but fatigues quickly with all mobility.  Pt requires close follow with bariatric w/c due to fatigue and needing to sit quickly.  Pt is able to dictate care re: equipment placement, etc.  Pt is nervous about going home if she is unable to go to rehab.  Follow Up Recommendations  Supervision - Intermittent;CIR     Does the patient have the potential to tolerate intense rehabilitation     Barriers to Discharge        Equipment Recommendations  Other (comment) (Bariatric 3-1 commode)    Recommendations for Other Services    Frequency Min 3X/week   Progress towards PT Goals Progress towards PT goals: Progressing toward goals  Plan Current plan remains appropriate    Precautions / Restrictions Precautions Precautions: Fall Precaution Comments: Bariatric   Pertinent Vitals/Pain No c/o pain.  Educated on pursed lip breathing.    Mobility  Bed Mobility Rolling: Supervision Supine to sit: +2 for safety/equipment (Pt and aide removed extenders, had to put arm rails down) General bed mobility comments: Pt able to move legs off of bed and sit up from supine(HOB elevated) with S.  A to get bed ready with removal of extenders and moving of rails. Transfers Overall transfer level: Needs assistance Equipment used: Rolling walker (2 wheeled) Transfers: Sit to/from Stand Sit to Stand: Supervision;+2 safety/equipment Stand pivot transfers: Supervision;+2 safety/equipment (w/c to [facility]) General transfer comment: pt relies heavily on momentum for sit to stand with wide BOS.  Pt not always able to stand on first attempt. Ambulation/Gait Ambulation/Gait assistance: +2  safety/equipment Ambulation Distance (Feet): 20 Feet (and then 14 feet) Assistive device: Rolling walker (2 wheeled) Gait Pattern/deviations: Wide base of support;Trunk flexed;Decreased step length - right;Decreased step length - left Gait velocity: decr General Gait Details: pt requries close follow with w/c with pillow to increase height of w/c.  fatigues quickly, no LOB.  When pt needs to sit, she needs to sit fairly quickly.  During gait 3 leads came off, so unable to get reading of HR during gait.  Verbal cueing for pursed lip breathing especially after 2nd gait despite being on o2 with gait.  Pt then needed to use the [facility].    Exercises     PT Diagnosis:    PT Problem List:   PT Treatment Interventions:     PT Goals (current goals can now be found in the care plan section) Acute Rehab PT Goals Patient Stated Goal: to go home after rehab PT Goal Formulation: With patient Potential to Achieve Goals: Good  Visit Information  Last PT Received On: 04/10/13 Assistance Needed: +2 History of Present Illness: Pt admit with CHF and SOB.    Subjective Data  Subjective: "I don't think I should go home."   Patient Stated Goal: to go home after rehab   Cognition  Cognition Arousal/Alertness: Awake/alert Behavior During Therapy: WFL for tasks assessed/performed Overall Cognitive Status: Within Functional Limits for tasks assessed    Balance  Balance Standing balance support: Bilateral upper extremity supported Standing balance-Leahy Scale: Fair High level balance activites: Backward walking (pt took a few backwards steps to get from w/c to [facility])  End of Session PT - End  of Session Equipment Utilized During Treatment: Oxygen Activity Tolerance: Patient limited by fatigue Patient left: with nursing/sitter in [facility] Nurse Communication: Mobility status   GP     Damari Hiltz LUBECK 04/10/2013, 1:00 PM

## 2013-04-10 NOTE — Progress Notes (Signed)
Pt made aware that she was going to be DC to home and that there was not a SNF that she could go to.  Pt expressed concern about being left alone and who would take care of her.  SW made aware.  Told pt that [facility] that would be coming to help her.  Pt verbalized understanding.

## 2013-11-11 ENCOUNTER — Emergency Department (HOSPITAL_COMMUNITY): Payer: Medicaid Other

## 2013-11-11 ENCOUNTER — Inpatient Hospital Stay (HOSPITAL_COMMUNITY)
Admission: EM | Admit: 2013-11-11 | Discharge: 2013-11-16 | DRG: 291 | Disposition: A | Discharge status: Home Health | Payer: Medicaid Other | Attending: Internal Medicine | Admitting: Internal Medicine

## 2013-11-11 ENCOUNTER — Encounter (HOSPITAL_COMMUNITY): Payer: Self-pay | Admitting: Emergency Medicine

## 2013-11-11 DIAGNOSIS — R1110 Vomiting, unspecified: Secondary | ICD-10-CM | POA: Diagnosis present

## 2013-11-11 DIAGNOSIS — E41 Nutritional marasmus: Secondary | ICD-10-CM | POA: Diagnosis present

## 2013-11-11 DIAGNOSIS — R946 Abnormal results of thyroid function studies: Secondary | ICD-10-CM | POA: Diagnosis present

## 2013-11-11 DIAGNOSIS — Z791 Long term (current) use of non-steroidal anti-inflammatories (NSAID): Secondary | ICD-10-CM | POA: Diagnosis not present

## 2013-11-11 DIAGNOSIS — I509 Heart failure, unspecified: Secondary | ICD-10-CM | POA: Diagnosis present

## 2013-11-11 DIAGNOSIS — E039 Hypothyroidism, unspecified: Secondary | ICD-10-CM | POA: Diagnosis present

## 2013-11-11 DIAGNOSIS — Z833 Family history of diabetes mellitus: Secondary | ICD-10-CM

## 2013-11-11 DIAGNOSIS — R600 Localized edema: Secondary | ICD-10-CM

## 2013-11-11 DIAGNOSIS — G4733 Obstructive sleep apnea (adult) (pediatric): Secondary | ICD-10-CM

## 2013-11-11 DIAGNOSIS — Z79899 Other long term (current) drug therapy: Secondary | ICD-10-CM | POA: Diagnosis not present

## 2013-11-11 DIAGNOSIS — Z6841 Body Mass Index (BMI) 40.0 and over, adult: Secondary | ICD-10-CM | POA: Diagnosis not present

## 2013-11-11 DIAGNOSIS — R0602 Shortness of breath: Secondary | ICD-10-CM | POA: Diagnosis present

## 2013-11-11 DIAGNOSIS — Z91013 Allergy to seafood: Secondary | ICD-10-CM | POA: Diagnosis not present

## 2013-11-11 DIAGNOSIS — D72829 Elevated white blood cell count, unspecified: Secondary | ICD-10-CM

## 2013-11-11 DIAGNOSIS — Z8249 Family history of ischemic heart disease and other diseases of the circulatory system: Secondary | ICD-10-CM | POA: Diagnosis not present

## 2013-11-11 DIAGNOSIS — J209 Acute bronchitis, unspecified: Secondary | ICD-10-CM | POA: Diagnosis present

## 2013-11-11 DIAGNOSIS — E662 Morbid (severe) obesity with alveolar hypoventilation: Secondary | ICD-10-CM | POA: Diagnosis present

## 2013-11-11 DIAGNOSIS — I5033 Acute on chronic diastolic (congestive) heart failure: Secondary | ICD-10-CM | POA: Diagnosis present

## 2013-11-11 DIAGNOSIS — R7989 Other specified abnormal findings of blood chemistry: Secondary | ICD-10-CM

## 2013-11-11 DIAGNOSIS — J189 Pneumonia, unspecified organism: Secondary | ICD-10-CM

## 2013-11-11 DIAGNOSIS — I5031 Acute diastolic (congestive) heart failure: Secondary | ICD-10-CM

## 2013-11-11 DIAGNOSIS — J9600 Acute respiratory failure, unspecified whether with hypoxia or hypercapnia: Secondary | ICD-10-CM | POA: Diagnosis present

## 2013-11-11 DIAGNOSIS — J9601 Acute respiratory failure with hypoxia: Secondary | ICD-10-CM

## 2013-11-11 DIAGNOSIS — R0902 Hypoxemia: Secondary | ICD-10-CM

## 2013-11-11 HISTORY — DX: Hypothyroidism, unspecified: E03.9

## 2013-11-11 LAB — CBC WITH DIFFERENTIAL/PLATELET
BASOS ABS: 0 10*3/uL (ref 0.0–0.1)
Basophils Relative: 0 % (ref 0–1)
EOS PCT: 3 % (ref 0–5)
Eosinophils Absolute: 0.3 10*3/uL (ref 0.0–0.7)
HCT: 41.8 % (ref 36.0–46.0)
Hemoglobin: 12.1 g/dL (ref 12.0–15.0)
LYMPHS PCT: 17 % (ref 12–46)
Lymphs Abs: 1.7 10*3/uL (ref 0.7–4.0)
MCH: 26.2 pg (ref 26.0–34.0)
MCHC: 28.9 g/dL — ABNORMAL LOW (ref 30.0–36.0)
MCV: 90.5 fL (ref 78.0–100.0)
Monocytes Absolute: 0.7 10*3/uL (ref 0.1–1.0)
Monocytes Relative: 8 % (ref 3–12)
NEUTROS ABS: 6.9 10*3/uL (ref 1.7–7.7)
Neutrophils Relative %: 72 % (ref 43–77)
PLATELETS: 260 10*3/uL (ref 150–400)
RBC: 4.62 MIL/uL (ref 3.87–5.11)
RDW: 14.8 % (ref 11.5–15.5)
WBC: 9.6 10*3/uL (ref 4.0–10.5)

## 2013-11-11 LAB — COMPREHENSIVE METABOLIC PANEL
ALK PHOS: 130 U/L — AB (ref 39–117)
ALT: 14 U/L (ref 0–35)
AST: 11 U/L (ref 0–37)
Albumin: 3 g/dL — ABNORMAL LOW (ref 3.5–5.2)
Anion gap: 7 (ref 5–15)
BILIRUBIN TOTAL: 0.4 mg/dL (ref 0.3–1.2)
BUN: 7 mg/dL (ref 6–23)
CHLORIDE: 101 meq/L (ref 96–112)
CO2: 31 mEq/L (ref 19–32)
Calcium: 8.9 mg/dL (ref 8.4–10.5)
Creatinine, Ser: 0.74 mg/dL (ref 0.50–1.10)
Glucose, Bld: 86 mg/dL (ref 70–99)
POTASSIUM: 4.7 meq/L (ref 3.7–5.3)
SODIUM: 139 meq/L (ref 137–147)
Total Protein: 6.5 g/dL (ref 6.0–8.3)

## 2013-11-11 LAB — URINALYSIS, ROUTINE W REFLEX MICROSCOPIC
GLUCOSE, UA: NEGATIVE mg/dL
Hgb urine dipstick: NEGATIVE
Ketones, ur: NEGATIVE mg/dL
Leukocytes, UA: NEGATIVE
Nitrite: NEGATIVE
Protein, ur: NEGATIVE mg/dL
Specific Gravity, Urine: 1.024 (ref 1.005–1.030)
UROBILINOGEN UA: 1 mg/dL (ref 0.0–1.0)
pH: 7 (ref 5.0–8.0)

## 2013-11-11 LAB — RAPID URINE DRUG SCREEN, HOSP PERFORMED
AMPHETAMINES: NOT DETECTED
BENZODIAZEPINES: NOT DETECTED
Barbiturates: NOT DETECTED
Cocaine: NOT DETECTED
OPIATES: NOT DETECTED
Tetrahydrocannabinol: POSITIVE — AB

## 2013-11-11 LAB — TROPONIN I: Troponin I: <0.3 ng/mL (ref <0.30)

## 2013-11-11 LAB — PRO B NATRIURETIC PEPTIDE: PRO B NATRI PEPTIDE: 75.9 pg/mL (ref 0–125)

## 2013-11-11 MED ORDER — CETYLPYRIDINIUM CHLORIDE 0.05 % MT LIQD
7.0000 mL | Freq: Two times a day (BID) | OROMUCOSAL | Status: DC
  Administered 2013-11-12 – 2013-11-16 (×7): 7 mL via OROMUCOSAL

## 2013-11-11 MED ORDER — IBUPROFEN 200 MG PO TABS: 600.0000 mg | ORAL_TABLET | Freq: Four times a day (QID) | ORAL | Status: DC | PRN

## 2013-11-11 MED ORDER — FERROUS SULFATE 325 (65 FE) MG PO TABS
325.0000 mg | ORAL_TABLET | Freq: Three times a day (TID) | ORAL | Status: DC
  Administered 2013-11-12 – 2013-11-16 (×14): 325 mg via ORAL
  Filled 2013-11-11 (×16): qty 1

## 2013-11-11 MED ORDER — HYDROMORPHONE HCL PF 1 MG/ML IJ SOLN
1.0000 mg | INTRAMUSCULAR | Status: DC | PRN
  Administered 2013-11-14 – 2013-11-16 (×7): 1 mg via INTRAVENOUS
  Filled 2013-11-11 (×8): qty 1

## 2013-11-11 MED ORDER — LEVOTHYROXINE SODIUM 25 MCG PO TABS
25.0000 ug | ORAL_TABLET | Freq: Every day | ORAL | Status: DC
  Administered 2013-11-12 – 2013-11-16 (×5): 25 ug via ORAL
  Filled 2013-11-11 (×6): qty 1

## 2013-11-11 MED ORDER — DIPHENHYDRAMINE HCL 25 MG PO CAPS
25.0000 mg | ORAL_CAPSULE | Freq: Every evening | ORAL | Status: DC | PRN
  Administered 2013-11-11 – 2013-11-13 (×3): 25 mg via ORAL
  Filled 2013-11-11 (×3): qty 1

## 2013-11-11 MED ORDER — ENOXAPARIN SODIUM 40 MG/0.4ML ~~LOC~~ SOLN
40.0000 mg | Freq: Once | SUBCUTANEOUS | Status: AC
  Administered 2013-11-11: 40 mg via SUBCUTANEOUS
  Filled 2013-11-11: qty 0.4

## 2013-11-11 MED ORDER — ENOXAPARIN SODIUM 150 MG/ML ~~LOC~~ SOLN: 1.0000 mg/kg | Freq: Once | SUBCUTANEOUS | Status: DC

## 2013-11-11 MED ORDER — SODIUM CHLORIDE 0.9 % IJ SOLN
3.0000 mL | Freq: Two times a day (BID) | INTRAMUSCULAR | Status: DC
  Administered 2013-11-11 – 2013-11-16 (×8): 3 mL via INTRAVENOUS

## 2013-11-11 MED ORDER — IPRATROPIUM-ALBUTEROL 0.5-2.5 (3) MG/3ML IN SOLN
3.0000 mL | Freq: Four times a day (QID) | RESPIRATORY_TRACT | Status: DC
  Administered 2013-11-11 – 2013-11-12 (×2): 3 mL via RESPIRATORY_TRACT
  Filled 2013-11-11 (×3): qty 3

## 2013-11-11 MED ORDER — ACETAMINOPHEN 325 MG PO TABS
650.0000 mg | ORAL_TABLET | Freq: Four times a day (QID) | ORAL | Status: DC | PRN
  Administered 2013-11-11 – 2013-11-13 (×4): 650 mg via ORAL
  Filled 2013-11-11 (×4): qty 2

## 2013-11-11 MED ORDER — ONDANSETRON HCL 4 MG PO TABS: 4.0000 mg | ORAL_TABLET | Freq: Four times a day (QID) | ORAL | Status: DC | PRN

## 2013-11-11 MED ORDER — FUROSEMIDE 40 MG PO TABS
60.0000 mg | ORAL_TABLET | Freq: Two times a day (BID) | ORAL | Status: DC
  Administered 2013-11-11 – 2013-11-12 (×2): 60 mg via ORAL
  Filled 2013-11-11 (×4): qty 1

## 2013-11-11 MED ORDER — NORETHINDRONE ACETATE 5 MG PO TABS
5.0000 mg | ORAL_TABLET | Freq: Every day | ORAL | Status: DC
  Administered 2013-11-11 – 2013-11-16 (×6): 5 mg via ORAL
  Filled 2013-11-11 (×6): qty 1

## 2013-11-11 MED ORDER — NORETHINDRONE ACETATE 5 MG PO TABS: 5.0000 mg | ORAL_TABLET | Freq: Every day | ORAL | Status: DC

## 2013-11-11 MED ORDER — ONDANSETRON HCL 4 MG/2ML IJ SOLN: 4.0000 mg | Freq: Four times a day (QID) | INTRAMUSCULAR | Status: DC | PRN

## 2013-11-11 MED ORDER — SODIUM CHLORIDE 0.9 % IV BOLUS (SEPSIS)
1000.0000 mL | Freq: Once | INTRAVENOUS | Status: AC
  Administered 2013-11-11: 1000 mL via INTRAVENOUS

## 2013-11-11 MED ORDER — ALBUTEROL SULFATE (2.5 MG/3ML) 0.083% IN NEBU: 2.5000 mg | INHALATION_SOLUTION | RESPIRATORY_TRACT | Status: DC | PRN

## 2013-11-11 MED ORDER — ASPIRIN-ACETAMINOPHEN-CAFFEINE 250-250-65 MG PO TABS
2.0000 | ORAL_TABLET | Freq: Four times a day (QID) | ORAL | Status: DC | PRN
  Filled 2013-11-11: qty 2

## 2013-11-11 MED ORDER — ENOXAPARIN SODIUM 40 MG/0.4ML ~~LOC~~ SOLN: 40.0000 mg | SUBCUTANEOUS | Status: DC

## 2013-11-11 MED ORDER — FUROSEMIDE 40 MG PO TABS
60.0000 mg | ORAL_TABLET | Freq: Two times a day (BID) | ORAL | Status: DC
  Filled 2013-11-11: qty 1

## 2013-11-11 MED ORDER — ENOXAPARIN SODIUM 150 MG/ML ~~LOC~~ SOLN
300.0000 mg | Freq: Once | SUBCUTANEOUS | Status: AC
Start: 2013-11-11 — End: 2013-11-11
  Administered 2013-11-11: 300 mg via SUBCUTANEOUS
  Filled 2013-11-11: qty 2

## 2013-11-11 NOTE — ED Notes (Signed)
Bed: WA02 Expected date:  Expected time:  Means of arrival:  Comments: EMS shob/baritric

## 2013-11-11 NOTE — Progress Notes (Signed)
Rt set up Auto CPAP 10-20 CMH20 via FFM with 4 LPM O2 bleed in.  Pt stated that she will self-administer when ready for bed.  Pt instructed to call if any complications should arise.  RT to monitor and assess as needed.

## 2013-11-11 NOTE — Progress Notes (Signed)
  CARE MANAGEMENT ED NOTE 11/11/2013  Patient:  Reason,Daleyza   Account Number:  [id number]  Date Initiated:  11/11/2013  Documentation initiated by:  Edd ArbourGIBBS,Chemika Nightengale  Subjective/Objective Assessment:   33 yr [place] pt c/o shortness of breath x a couple days.     Subjective/Objective Assessment Detail:   [facility]  Aide at bedside  pcp henry tripp  wt 728 lbs and 14.5 oz Bariatric  [facility]     Action/Plan:   ED CM spoke wtih pt about home health services (aide at bedside)   Action/Plan Detail:   Anticipated DC Date:       Status Recommendation to Physician:   Result of Recommendation:    Other ED Services  Consult Working Plan    DC Planning Services  Other  Outpatient Services - Pt will follow up    Choice offered to / List presented to:            Status of service:  Completed, signed off  ED Comments:   ED Comments Detail:

## 2013-11-11 NOTE — H&P (Addendum)
[facility] History and Physical  Isabel Roberts GNF:[id number] DOB: 1981/01/24 DOA: 11/11/2013  Referring physician: ER physician PCP: [name], MD   Chief Complaint: shortness of breath and chest pain  HPI: Pt is 33 yo female, morbidly obese with underlying diastolic CHF per last 2 D ECHO in 2014 but very difficult study due to body habitus, now presenting to [facility] ED with main concern of several days duration of progressively worsening shortness of breath at rest and exertional, associated with mid sternal area chest tightness. Pt explain she has used albuterol inhaler but her symptoms persisted and she came to the ED. She also explains that this has been associated with several episodes of non bloody vomiting but she denies fevers, chills, abdominal pain , no urinary concerns. She denies similar events in the past.   In ED, pt noted to be hemodynamically stable but with oxygen saturation in mid 80's on RA. TR[facility] asked to admit for further evaluation. Telemetry bed requested.   Assessment & Plan    Principal Problem:   Acute hypoxic respiratory failure  this appears to be multifactorial and secondary to OHS with ? Diastolic CHF (difficult to assess due to body habitus)  admit to telemetry unit  place on oxygen, BD's scheduled and as needed  check troponins, BNP, 2 D ECHO  V/Q scan pending to rule out PE Active Problems:   Morbid obesity  nutritionist consultation    Vomiting  unclear etiology  provide antiemetics as needed  advance diet as pt able to tolerate    Chest pain   monitor on telemetry  cycle CE's, check TSH, 2 D ECHO    Obesity hypoventilation syndrome  oxygen provided    Severe malnutrition  secondary to severe morbid obesity    Acute on Chronic diastolic CHF  Difficult to assess exact volume status due to body habitus  Continue home regimen with Lasix   2 D ECHO requested   DVT prophylaxis  Lovenox subQ while pt is in hospital    Radiological Exams on Admission: Dg Chest 2 View  11/11/2013   No definite acute findings.     EKG: pending  Code Status: Full Family Communication: Plan of care discussed with the patient  Disposition Plan: Admit for further evaluation  [name], MD  [facility] Pager [phone]  Review of Systems:  Constitutional: Negative for diaphoresis.  HENT: Negative for hearing loss, ear pain, nosebleeds, congestion, tinnitus and ear discharge.   Eyes: Negative for blurred vision, double vision, photophobia, pain, discharge and redness.  Respiratory: Negative for wheezing and stridor.   Cardiovascular: Negative for palpitations, orthopnea.  Gastrointestinal: Negative for heartburn, constipation, blood in stool and melena.  Genitourinary: Negative for dysuria, urgency, frequency, hematuria and flank pain.  Musculoskeletal: Negative for myalgias, back pain, joint pain and falls.  Skin: Negative for itching and rash.  Neurological: Negative for dizziness and weakness.  Endo/Heme/Allergies: Negative for environmental allergies and polydipsia. Does not bruise/bleed easily.  Psychiatric/Behavioral: Negative for suicidal ideas. The patient is not nervous/anxious.    Past Medical History  Diagnosis Date  . Sleep apnea   . Pneumonia   . Obesity   . Vaginal bleeding   . Pneumonia   . Hypothyroidism    Past Surgical History  Procedure Laterality Date  . Vagina surgery  09/2011   Social History:  reports that she has never smoked. She has never used smokeless tobacco. She reports that she drinks alcohol. She reports that she does not use illicit  drugs.  Allergies  Allergen Reactions  . Shrimp [Shellfish Allergy] Anaphylaxis  . Fish Allergy Hives and Other (See Comments)    Throat swells  . Other Other (See Comments)    Grass causes a rash    Family History:  Family History  Problem Relation Age of Onset  . Diabetes Mother   . Hypertension Mother   . Hypertension Father    . Diabetes Father      Prior to Admission medications   Medication Sig Start Date End Date Taking? Authorizing Provider  albuterol (PROVENTIL HFA;VENTOLIN HFA) 108 (90 BASE) MCG/ACT inhaler Inhale 2 puffs into the lungs daily as needed for wheezing or shortness of breath.   Yes Historical Provider, MD  aspirin-acetaminophen-caffeine (EXCEDRIN MIGRAINE) [phone] MG per tablet Take 2 tablets by mouth every 6 (six) hours as needed for headache.   Yes Historical Provider, MD  ferrous sulfate 325 (65 FE) MG tablet Take 325 mg by mouth 3 (three) times daily with meals.   Yes Historical Provider, MD  furosemide (LASIX) 40 MG tablet Take 1.5 tablets (60 mg total) by mouth 2 (two) times daily. 04/10/13  Yes Vassie Loll, MD  ibuprofen (ADVIL,MOTRIN) 200 MG tablet Take 600 mg by mouth every 6 (six) hours as needed for moderate pain.   Yes Historical Provider, MD  levothyroxine (SYNTHROID, LEVOTHROID) 25 MCG tablet Take 25 mcg by mouth daily before breakfast.   Yes Historical Provider, MD  norethindrone (AYGESTIN) 5 MG tablet Take 5 mg by mouth daily.   Yes Historical Provider, MD  Vitamin D, Ergocalciferol, (DRISDOL) 50000 UNITS CAPS capsule Take 50,000 Units by mouth every 7 (seven) days. Tuesdays   Yes Historical Provider, MD  VITAMIN E PO Take 1 capsule by mouth daily.   Yes Historical Provider, MD   Physical Exam: Filed Vitals:   11/11/13 1920 11/11/13 1927 11/11/13 1934 11/11/13 1941  BP:      Pulse: 78 78 77 83  Temp:      TempSrc:      Resp: 24 17 27 19   Height:      Weight:      SpO2: 98% 100% 99% 98%    Physical Exam  Constitutional: Appears stable, morbidly obese, NAD HENT: Normocephalic. No tonsillar erythema or exudates Eyes: Conjunctivae and EOM are normal. PERRLA, no scleral icterus.  Neck: Normal ROM. Neck supple. No JVD. No tracheal deviation. No thyromegaly.  CVS: RRR, S1/S2 +, no murmurs, no gallops, no carotid bruit. Very difficult exam due to body habitus  Pulmonary:  Poor inspiratory effort due to body habbitus Abdominal: Soft. BS +,  no distension, tenderness, rebound or guarding.  Musculoskeletal: Normal range of motion. Lymphedema. Lymphadenopathy: No lymphadenopathy noted, cervical, inguinal appreciated Neuro: Alert. Normal reflexes, muscle tone coordination. No focal neurologic deficits. Skin: Skin is warm and dry. No rash noted. Not diaphoretic.  Psychiatric: Normal mood and affect.   Labs on Admission:  Basic Metabolic Panel:  Recent Labs Lab 11/11/13 1647  NA 139  K 4.7  CL 101  CO2 31  GLUCOSE 86  BUN 7  CREATININE 0.74  CALCIUM 8.9   Liver Function Tests:  Recent Labs Lab 11/11/13 1647  AST 11  ALT 14  ALKPHOS 130*  BILITOT 0.4  PROT 6.5  ALBUMIN 3.0*   No results found for this basename: LIPASE, AMYLASE,  in the last 168 hours No results found for this basename: AMMONIA,  in the last 168 hours CBC:  Recent Labs Lab 11/11/13 1647  WBC  9.6  NEUTROABS 6.9  HGB 12.1  HCT 41.8  MCV 90.5  PLT 260   Cardiac Enzymes:  Recent Labs Lab 11/11/13 1647  TROPONINI <0.30    If 7PM-7AM, please contact night-coverage [email or web address] Password [email or web address] 11/11/2013, 8:22 PM

## 2013-11-11 NOTE — ED Provider Notes (Signed)
CSN: [id number]     Arrival date & time 11/11/13  1500 History   First MD Initiated Contact with Patient 11/11/13 1533     Chief Complaint  Patient presents with  . Shortness of Breath     (Consider location/radiation/quality/duration/timing/severity/associated sxs/prior Treatment) Patient is a 33 y.o. female presenting with shortness of breath. The history is provided by the patient. No language interpreter was used.  Shortness of Breath Severity:  Moderate Associated symptoms: vomiting   Associated symptoms: no abdominal pain, no fever and no rash   Associated symptoms comment:  Chest tightness in the center and left chest associated with shortness of breath. She has had symptoms for 2-3 days and states she has used her albuterol nebulizer without significant relief as well as increased her oxygen supplementation from nighttime only to all day. She has had limited vomiting (3 in three days), and one episode of loose stool that was non-bloody. She reports decreased appetite as well as decreased urination.    Past Medical History  Diagnosis Date  . Sleep apnea   . Pneumonia   . Obesity   . Vaginal bleeding   . Pneumonia   . Hypothyroidism    Past Surgical History  Procedure Laterality Date  . Vagina surgery  09/2011   Family History  Problem Relation Age of Onset  . Diabetes Mother   . Hypertension Mother   . Hypertension Father   . Diabetes Father    History  Substance Use Topics  . Smoking status: Never Smoker   . Smokeless tobacco: Never Used  . Alcohol Use: Yes     Comment: Pt states "I drink every now and again"   OB History   Grav Para Term Preterm Abortions TAB SAB Ect Mult Living   1 1        0     Review of Systems  Constitutional: Positive for appetite change. Negative for fever.  HENT: Negative for congestion.   Respiratory: Positive for chest tightness and shortness of breath.   Gastrointestinal: Positive for nausea and vomiting. Negative for abdominal  pain and blood in stool.  Genitourinary: Positive for decreased urine volume.  Musculoskeletal: Negative for myalgias and neck stiffness.  Skin: Negative for rash and wound.  Neurological: Negative for weakness.      Allergies  Fish allergy; Other; and Shrimp  Home Medications   Prior to Admission medications   Medication Sig Start Date End Date Taking? Authorizing Provider  albuterol (PROVENTIL HFA;VENTOLIN HFA) 108 (90 BASE) MCG/ACT inhaler Inhale 2 puffs into the lungs daily as needed for wheezing or shortness of breath.    Historical Provider, MD  aspirin-acetaminophen-caffeine (EXCEDRIN MIGRAINE) [phone] MG per tablet Take 2 tablets by mouth every 6 (six) hours as needed for headache.    Historical Provider, MD  ferrous sulfate 325 (65 FE) MG tablet Take 325 mg by mouth 3 (three) times daily with meals.    Historical Provider, MD  fluconazole (DIFLUCAN) 200 MG tablet Take 2 tablets (400 mg total) by mouth daily at 6 PM. 04/10/13   Vassie Lollarlos Madera, MD  furosemide (LASIX) 40 MG tablet Take 1.5 tablets (60 mg total) by mouth 2 (two) times daily. 04/10/13   Vassie Lollarlos Madera, MD  norethindrone (AYGESTIN) 5 MG tablet Take 5 mg by mouth daily.    Historical Provider, MD  Vitamin D, Ergocalciferol, (DRISDOL) 50000 UNITS CAPS capsule Take 50,000 Units by mouth every 7 (seven) days. Tuesdays    Historical Provider, MD  VITAMIN E  PO Take 1 capsule by mouth daily.    Historical Provider, MD   BP 95/58  Pulse 82  Temp(Src) 98.7 F (37.1 C) (Oral)  Resp 16  Ht 5\' 4"  (1.626 m)  Wt 750 lb (340.198 kg)  BMI 128.67 kg/m2  SpO2 98% Physical Exam  Constitutional: She is oriented to person, place, and time. She appears well-developed and well-nourished. No distress.  Currently on 4L O2 via [place] and is in NAD. She is morbidly obese, limiting full examination.  HENT:  Head: Normocephalic.  Neck: Normal range of motion. Neck supple.  Cardiovascular: Normal rate and regular rhythm.   Pulmonary/Chest:  Effort normal and breath sounds normal.  Abdominal: Soft. Bowel sounds are normal. There is no tenderness. There is no rebound and no guarding.  Musculoskeletal: Normal range of motion.  Neurological: She is alert and oriented to person, place, and time.  Skin: Skin is warm and dry. No rash noted.  Psychiatric: She has a normal mood and affect.    ED Course  Procedures (including critical care time) Labs Review Labs Reviewed  URINALYSIS, ROUTINE W REFLEX MICROSCOPIC  CBC WITH DIFFERENTIAL  COMPREHENSIVE METABOLIC PANEL    Imaging Review No results found.   EKG Interpretation None      MDM   Final diagnoses:  None    1. Hypoxia 2. Morbid obesity  Exam showing hypoxia on room air, increased oxygen need over usual night time only oxygen with CPAP. She is well appearing, non-toxic. Evaluation in ED unable to explain hypoxia. Patient is admitted for further evaluation and treatment. VQ scan pending at time of admission. Discussed with [facility] who accepts the patient to their service.    Arnoldo Hooker, PA-C 11/19/13 [phone]

## 2013-11-11 NOTE — ED Notes (Signed)
Pt c/o shortness of breath x a couple days.

## 2013-11-11 NOTE — ED Notes (Signed)
IV RN paged for pt.

## 2013-11-11 NOTE — ED Notes (Signed)
Patient transported to X-ray 

## 2013-11-12 DIAGNOSIS — J189 Pneumonia, unspecified organism: Secondary | ICD-10-CM

## 2013-11-12 DIAGNOSIS — I509 Heart failure, unspecified: Secondary | ICD-10-CM

## 2013-11-12 DIAGNOSIS — I5033 Acute on chronic diastolic (congestive) heart failure: Principal | ICD-10-CM

## 2013-11-12 DIAGNOSIS — I517 Cardiomegaly: Secondary | ICD-10-CM

## 2013-11-12 LAB — BASIC METABOLIC PANEL
Anion gap: 7 (ref 5–15)
BUN: 8 mg/dL (ref 6–23)
CALCIUM: 8.8 mg/dL (ref 8.4–10.5)
CO2: 31 mEq/L (ref 19–32)
CREATININE: 0.78 mg/dL (ref 0.50–1.10)
Chloride: 102 mEq/L (ref 96–112)
GFR calc Af Amer: >90 mL/min (ref >90)
GLUCOSE: 123 mg/dL — AB (ref 70–99)
Potassium: 4.1 mEq/L (ref 3.7–5.3)
Sodium: 140 mEq/L (ref 137–147)

## 2013-11-12 LAB — CBC
HEMATOCRIT: 40.2 % (ref 36.0–46.0)
Hemoglobin: 11.8 g/dL — ABNORMAL LOW (ref 12.0–15.0)
MCH: 26 pg (ref 26.0–34.0)
MCHC: 29.4 g/dL — AB (ref 30.0–36.0)
MCV: 88.7 fL (ref 78.0–100.0)
PLATELETS: 248 10*3/uL (ref 150–400)
RBC: 4.53 MIL/uL (ref 3.87–5.11)
RDW: 14.8 % (ref 11.5–15.5)
WBC: 8.5 10*3/uL (ref 4.0–10.5)

## 2013-11-12 LAB — TROPONIN I: Troponin I: <0.3 ng/mL (ref <0.30)

## 2013-11-12 LAB — TSH: TSH: 5.57 u[IU]/mL — ABNORMAL HIGH (ref 0.350–4.500)

## 2013-11-12 LAB — GLUCOSE, CAPILLARY: Glucose-Capillary: 115 mg/dL — ABNORMAL HIGH (ref 70–99)

## 2013-11-12 MED ORDER — FUROSEMIDE 40 MG PO TABS
60.0000 mg | ORAL_TABLET | Freq: Three times a day (TID) | ORAL | Status: DC
  Administered 2013-11-12 – 2013-11-15 (×9): 60 mg via ORAL
  Filled 2013-11-12 (×12): qty 1

## 2013-11-12 MED ORDER — GUAIFENESIN ER 600 MG PO TB12
1200.0000 mg | ORAL_TABLET | Freq: Two times a day (BID) | ORAL | Status: DC
  Administered 2013-11-12 – 2013-11-16 (×9): 1200 mg via ORAL
  Filled 2013-11-12 (×11): qty 2

## 2013-11-12 MED ORDER — ENOXAPARIN SODIUM 150 MG/ML ~~LOC~~ SOLN
150.0000 mg | Freq: Every day | SUBCUTANEOUS | Status: DC
  Administered 2013-11-12 – 2013-11-15 (×4): 150 mg via SUBCUTANEOUS
  Filled 2013-11-12 (×5): qty 1

## 2013-11-12 MED ORDER — LEVOFLOXACIN IN D5W 750 MG/150ML IV SOLN
750.0000 mg | INTRAVENOUS | Status: DC
  Administered 2013-11-12 – 2013-11-13 (×2): 750 mg via INTRAVENOUS
  Filled 2013-11-12 (×3): qty 150

## 2013-11-12 MED ORDER — IPRATROPIUM-ALBUTEROL 0.5-2.5 (3) MG/3ML IN SOLN
3.0000 mL | Freq: Four times a day (QID) | RESPIRATORY_TRACT | Status: DC | PRN
Start: 2013-11-12 — End: 2013-11-16

## 2013-11-12 NOTE — Progress Notes (Signed)
Echocardiogram 2D Echocardiogram has been performed.  Dorothey BasemanReel, Anstine M 11/12/2013, 3:37 PM

## 2013-11-12 NOTE — Progress Notes (Signed)
Pt transported to floor via bariatric bed. VSS at this time. No distress noted. Will continue to monitor pt closely. Mardene CelesteAsaro, Christophor Eick I

## 2013-11-12 NOTE — Progress Notes (Signed)
Nutrition Brief Note  Patient identified on the Malnutrition Screening Tool (MST) Report  Wt Readings from Last 15 Encounters:  11/11/13 750 lb (340.198 kg)  04/10/13 728 lb 14.5 oz (330.63 kg)  12/20/12 749 lb 2 oz (339.8 kg)  12/11/12 764 lb 8.9 oz (346.8 kg)    Body mass index is 128.67 kg/(m^2). Patient meets criteria for Morbid Obesity/Obesity IIII based on current BMI.   Current diet order is Carb Modified, patient is consuming approximately 100% of meals at this time. Labs and medications reviewed.   Pt educated on weight loss recommendations. See education note for further details.  No additional nutrition interventions warranted at this time. If nutrition issues arise, please consult RD.   Lloyd HugerSarah F Crewe Heathman MS RD LDN Clinical Dietitian Pager:[phone]

## 2013-11-12 NOTE — Progress Notes (Signed)
[facility] PROGRESS NOTE   Genia Hottericole Padron ZOX:[id number] DOB: [birth year] DOA: 11/11/2013 PCP: Florentina JennyRIPP, HENRY, MD  HPI/Subjective: Still have some pleuritic chest pain, denies any fever or chills.  Assessment/Plan: Active Problems:   Hypoxia   Acute hypoxic respiratory failure  this appears to be multifactorial and secondary to OHS with ? Diastolic CHF (difficult to assess due to body habitus)  admit to telemetry unit  place on oxygen, BD's scheduled and as needed  Check 2-D echo, BNP is normal. V/Q scan pending to rule out PE. Patient reports his greenish sputum will start on antibiotic, and increase diuresis.  Morbid obesity  nutritionist consultation  Vomiting  unclear etiology  provide antiemetics as needed  advance diet as pt able to tolerate  Chest pain  monitor on telemetry 3 sets of cardiac enzymes are negative, VQ scan is pending  Obesity hypoventilation syndrome  oxygen provided  Severe malnutrition  secondary to severe morbid obesity  Acute on Chronic diastolic CHF  Difficult to assess exact volume status due to body habitus  Continue home regimen with Lasix  2 D ECHO requested  Code Status: Full code Family Communication: Plan discussed with the patient. Disposition Plan: Remains inpatient   Consultants:  None  Procedures:  None  Antibiotics:  None   Objective: Filed Vitals:   11/12/13 0448  BP: 114/61  Pulse: 94  Temp: 98.5 F (36.9 C)  Resp: 18    Intake/Output Summary (Last 24 hours) at 11/12/13 1055 Last data filed at 11/12/13 1028  Gross per 24 hour  Intake      0 ml  Output   7575 ml  Net  -7575 ml   Filed Weights   11/11/13 1536 11/11/13 2120  Weight: 340.198 kg (750 lb) 340.198 kg (750 lb)    Exam: General: Alert and awake, oriented x3, not in any acute distress. HEENT: anicteric sclera, pupils reactive to light and accommodation, EOMI CVS: S1-S2 clear, no murmur rubs or gallops Chest: clear to auscultation  bilaterally, no wheezing, rales or rhonchi Abdomen: soft nontender, nondistended, normal bowel sounds, no organomegaly Extremities: no cyanosis, clubbing or edema noted bilaterally Neuro: Cranial nerves II-XII intact, no focal neurological deficits  Data Reviewed: Basic Metabolic Panel:  Recent Labs Lab 11/11/13 1647 11/12/13 0109  NA 139 140  K 4.7 4.1  CL 101 102  CO2 31 31  GLUCOSE 86 123*  BUN 7 8  CREATININE 0.74 0.78  CALCIUM 8.9 8.8   Liver Function Tests:  Recent Labs Lab 11/11/13 1647  AST 11  ALT 14  ALKPHOS 130*  BILITOT 0.4  PROT 6.5  ALBUMIN 3.0*   No results found for this basename: LIPASE, AMYLASE,  in the last 168 hours No results found for this basename: AMMONIA,  in the last 168 hours CBC:  Recent Labs Lab 11/11/13 1647 11/12/13 0109  WBC 9.6 8.5  NEUTROABS 6.9  --   HGB 12.1 11.8*  HCT 41.8 40.2  MCV 90.5 88.7  PLT 260 248   Cardiac Enzymes:  Recent Labs Lab 11/11/13 1647 11/12/13 0109 11/12/13 0640  TROPONINI <0.30 <0.30 <0.30   BNP (last 3 results)  Recent Labs  12/16/12 1654 04/05/13 1523 11/11/13 2040  PROBNP 41.4 10.6 75.9   CBG:  Recent Labs Lab 11/12/13 0803  GLUCAP 115*    Micro No results found for this or any previous visit (from the past 240 hour(s)).   Studies: Dg Chest 2 View  11/11/2013   CLINICAL DATA:  Shortness of  breath and weakness.  EXAM: CHEST  2 VIEW  COMPARISON:  04/05/2013.  FINDINGS: Trachea is midline. Heart is enlarged. Lungs are grossly clear. No definite pleural fluid. Lateral view is nondiagnostic due to body habitus.  IMPRESSION: No definite acute findings.   Electronically Signed   By: Leanna Battles M.D.   On: 11/11/2013 17:28    Scheduled Meds: . antiseptic oral rinse  7 mL Mouth Rinse q12n4p  . enoxaparin (LOVENOX) injection  150 mg Subcutaneous QHS  . ferrous sulfate  325 mg Oral TID WC  . furosemide  60 mg Oral BID  . levothyroxine  25 mcg Oral QAC breakfast  .  norethindrone  5 mg Oral Daily  . sodium chloride  3 mL Intravenous Q12H   Continuous Infusions:      Time spent: 35 minutes    [facility]  [facility] Pager [phone] If 7PM-7AM, please contact night-coverage at [email or web address], password [facility] 11/12/2013, 10:55 AM  LOS: 1 day

## 2013-11-12 NOTE — Progress Notes (Signed)
Lovenox per Pharmacy for DVT Prophylaxis    Pharmacy has been consulted from dosing enoxaparin (lovenox) in this patient for DVT prophylaxis.  The pharmacist has reviewed pertinent labs (Hgb _11.8__; PLT_248__), patient weight (_340__kg) and renal function (CrCl_>90__mL/min) and decided that enoxaparin _150_mg SQ Q_24_Hrs is appropriate for this patient.   This is just over 24hr from prior 1mg /kg dose given in ED.   Thank you  Luetta NuttingJulian Crowford Daneesha Quinteros, Jr PharmD, BCPS  11/12/2013, 5:13 AM

## 2013-11-12 NOTE — Plan of Care (Signed)
Problem: Food- and Nutrition-Related Knowledge Deficit (NB-1.1) Goal: Nutrition education Formal process to instruct or train a patient/client in a skill or to impart knowledge to help patients/clients voluntarily manage or modify food choices and eating behavior to maintain or improve health. Outcome: Completed/Met Date Met:  11/12/13  RD discretion for nutrition education regarding weight loss d/t Obesity > 40.0  Body mass index is 128.67 kg/(m^2). Pt meets criteria for Morbid Obesity based on current BMI.  RD provided "Weight Loss Tips" handout from the Academy of [employer]etics. Emphasized the importance of serving sizes and provided examples of correct portions of common foods. Discussed importance of controlled and consistent intake throughout the day. Provided examples of ways to balance meals/snacks and encouraged intake of high-fiber, whole grain complex carbohydrates. Emphasized the importance of hydration with calorie-free beverages and limiting sugar-sweetened beverages. Encouraged pt to discuss physical activity options with physician. Teach back method used.  Expect fair compliance. Pt has been educated by RD during previous admits regarding heart healthy diet and weight loss. Pt reported being able to cook with assistance from her home health aid. Has been trying to incorporate more vegetables into diet by starting garden in backyard and freezing leftover produce. Did note she has been having trouble with portion control. Pt enjoys eating chips for snacks. Encouraged pt to opt for baked chips or utilizing pretzel, fruits, yogurts, or heart healthy nuts for snacks. Discussed pre-portioning snacks to prevent overeating.   RD contact information provided. If additional nutrition issues arise, please re-consult RD.  Atlee Abide MS RD LDN Clinical Dietitian [phone]

## 2013-11-12 NOTE — Progress Notes (Signed)
RT entered room and patient stated that she was going to apply the CPAP (Auto CPAP 10-20 cmH2O via FFM with 4 LPM O2 bleed in) once the nurse gave medications. RT advised patient if she needed anything to give a call. RT will continue to monitor.

## 2013-11-13 DIAGNOSIS — J209 Acute bronchitis, unspecified: Secondary | ICD-10-CM | POA: Diagnosis present

## 2013-11-13 DIAGNOSIS — G4733 Obstructive sleep apnea (adult) (pediatric): Secondary | ICD-10-CM

## 2013-11-13 DIAGNOSIS — J9600 Acute respiratory failure, unspecified whether with hypoxia or hypercapnia: Secondary | ICD-10-CM

## 2013-11-13 LAB — BASIC METABOLIC PANEL
Anion gap: 8 (ref 5–15)
BUN: 9 mg/dL (ref 6–23)
CHLORIDE: 98 meq/L (ref 96–112)
CO2: 36 mEq/L — ABNORMAL HIGH (ref 19–32)
Calcium: 9.5 mg/dL (ref 8.4–10.5)
Creatinine, Ser: 0.8 mg/dL (ref 0.50–1.10)
GFR calc non Af Amer: >90 mL/min (ref >90)
Glucose, Bld: 108 mg/dL — ABNORMAL HIGH (ref 70–99)
POTASSIUM: 4.2 meq/L (ref 3.7–5.3)
SODIUM: 142 meq/L (ref 137–147)

## 2013-11-13 MED ORDER — LEVOFLOXACIN 750 MG PO TABS
750.0000 mg | ORAL_TABLET | Freq: Every day | ORAL | Status: DC
  Administered 2013-11-14 – 2013-11-16 (×3): 750 mg via ORAL
  Filled 2013-11-13 (×3): qty 1

## 2013-11-13 NOTE — Progress Notes (Signed)
PT Cancellation Note  Patient Details Name: Isabel Roberts MRN: [id number] DOB: 08-19-1980   Cancelled Treatment:    Reason Eval/Treat Not Completed: Medical issues which prohibited therapy (awaiting V/Q scan results)   Rozann Holts,KATHrine E 11/13/2013, 10:26 AM Zenovia JarredKati Nicky Kras, PT, DPT 11/13/2013 Pager: [phone]

## 2013-11-13 NOTE — Progress Notes (Signed)
[facility] is providing the following services: [employer] (to be shipped to patients home)  If patient discharges after hours, please call [phone].   Renard HamperLecretia Williamson 11/13/2013, 4:04 PM

## 2013-11-13 NOTE — Progress Notes (Signed)
[facility] PROGRESS NOTE   Isabel Roberts ZOX:[id number] DOB: [birth year] DOA: 11/11/2013 PCP: [name], MD  HPI/Subjective: Still have some pleuritic chest pain, denies any fever or chills.  Assessment/Plan: Principal Problem:   Acute respiratory failure Active Problems:   Morbid obesity   OSA (obstructive sleep apnea)   Acute diastolic CHF (congestive heart failure), NYHA class 3   Acute bronchitis   Acute hypoxic respiratory failure  this appears to be multifactorial and secondary to OHS with ? Diastolic CHF (difficult to assess due to body habitus)  admit to telemetry unit  place on oxygen, BD's scheduled and as needed  Check 2-D echo, BNP is normal. V/Q scan pending to rule out PE. Patient reports his greenish sputum will start on antibiotic, and continue diuresis.  Morbid obesity  nutritionist consultation  Vomiting  unclear etiology  provide antiemetics as needed  advance diet as pt able to tolerate, this is resolved.  Chest pain  Monitor on telemetry.  3 sets of cardiac enzymes are negative, VQ scan is pending  Obesity hypoventilation syndrome  oxygen provided  Severe malnutrition  secondary to severe morbid obesity  Acute on chronic diastolic CHF  Difficult to assess exact volume status due to body habitus  Continue home regimen with Lasix  2 D ECHO requested  Code Status: Full code Family Communication: Plan discussed with the patient. Disposition Plan: Remains inpatient   Consultants:  None  Procedures:  None  Antibiotics:  None   Objective: Filed Vitals:   11/13/13 0445  BP: 111/56  Pulse: 87  Temp: 98.5 F (36.9 C)  Resp: 20    Intake/Output Summary (Last 24 hours) at 11/13/13 1145 Last data filed at 11/13/13 1003  Gross per 24 hour  Intake    990 ml  Output  81191 ml  Net -16485 ml   Filed Weights   11/11/13 1536 11/11/13 2120 11/13/13 0445  Weight: 340.198 kg (750 lb) 340.198 kg (750 lb) 368.321 kg (812 lb)     Exam: General: Alert and awake, oriented x3, not in any acute distress. HEENT: anicteric sclera, pupils reactive to light and accommodation, EOMI CVS: S1-S2 clear, no murmur rubs or gallops Chest: clear to auscultation bilaterally, no wheezing, rales or rhonchi Abdomen: soft nontender, nondistended, normal bowel sounds, no organomegaly Extremities: no cyanosis, clubbing or edema noted bilaterally Neuro: Cranial nerves II-XII intact, no focal neurological deficits  Data Reviewed: Basic Metabolic Panel:  Recent Labs Lab 11/11/13 1647 11/12/13 0109 11/13/13 0430  NA 139 140 142  K 4.7 4.1 4.2  CL 101 102 98  CO2 31 31 36*  GLUCOSE 86 123* 108*  BUN 7 8 9   CREATININE 0.74 0.78 0.80  CALCIUM 8.9 8.8 9.5   Liver Function Tests:  Recent Labs Lab 11/11/13 1647  AST 11  ALT 14  ALKPHOS 130*  BILITOT 0.4  PROT 6.5  ALBUMIN 3.0*   No results found for this basename: LIPASE, AMYLASE,  in the last 168 hours No results found for this basename: AMMONIA,  in the last 168 hours CBC:  Recent Labs Lab 11/11/13 1647 11/12/13 0109  WBC 9.6 8.5  NEUTROABS 6.9  --   HGB 12.1 11.8*  HCT 41.8 40.2  MCV 90.5 88.7  PLT 260 248   Cardiac Enzymes:  Recent Labs Lab 11/11/13 1647 11/12/13 0109 11/12/13 0640 11/12/13 1354  TROPONINI <0.30 <0.30 <0.30 <0.30   BNP (last 3 results)  Recent Labs  12/16/12 1654 04/05/13 1523 11/11/13 2040  PROBNP 41.4  10.6 75.9   CBG:  Recent Labs Lab 11/12/13 0803  GLUCAP 115*    Micro No results found for this or any previous visit (from the past 240 hour(s)).   Studies: Dg Chest 2 View  11/11/2013   CLINICAL DATA:  Shortness of breath and weakness.  EXAM: CHEST  2 VIEW  COMPARISON:  04/05/2013.  FINDINGS: Trachea is midline. Heart is enlarged. Lungs are grossly clear. No definite pleural fluid. Lateral view is nondiagnostic due to body habitus.  IMPRESSION: No definite acute findings.   Electronically Signed   By: Leanna BattlesMelinda   Blietz M.D.   On: 11/11/2013 17:28    Scheduled Meds: . antiseptic oral rinse  7 mL Mouth Rinse q12n4p  . enoxaparin (LOVENOX) injection  150 mg Subcutaneous QHS  . ferrous sulfate  325 mg Oral TID WC  . furosemide  60 mg Oral 3 times per day  . guaiFENesin  1,200 mg Oral BID  . levofloxacin (LEVAQUIN) IV  750 mg Intravenous Q24H  . levothyroxine  25 mcg Oral QAC breakfast  . norethindrone  5 mg Oral Daily  . sodium chloride  3 mL Intravenous Q12H   Continuous Infusions:      Time spent: 35 minutes    [facility] Regional Medical CenterELMAHI,Isabel Roberts  [facility] Pager [phone] If 7PM-7AM, please contact night-coverage at [email or web address], password [email or web address] 11/13/2013, 11:45 AM  LOS: 2 days

## 2013-11-13 NOTE — Progress Notes (Signed)
RT set CPAP on auto titrate 10-20cmH2O via FFM. Sterile water was added for humidification. Pt prefers to place herself on CPAP when ready. Four liters of oxygen will be bled in per home settings upon application. Pt was encouraged to contact RT if needed. RT will continue to monitor as needed.

## 2013-11-13 NOTE — Progress Notes (Signed)
Spoke with pt concerning home health. Pt states that [facility], [facility]  Pt asked for DME Bariatric  bedside commode. There are no other needs at present time.

## 2013-11-13 NOTE — Progress Notes (Signed)
Pt got up to chair,  but was only able to tolerated it for 15 minutes

## 2013-11-14 DIAGNOSIS — D72829 Elevated white blood cell count, unspecified: Secondary | ICD-10-CM

## 2013-11-14 LAB — BASIC METABOLIC PANEL
Anion gap: 10 (ref 5–15)
BUN: 10 mg/dL (ref 6–23)
CO2: 34 mEq/L — ABNORMAL HIGH (ref 19–32)
Calcium: 9.6 mg/dL (ref 8.4–10.5)
Chloride: 95 mEq/L — ABNORMAL LOW (ref 96–112)
Creatinine, Ser: 0.87 mg/dL (ref 0.50–1.10)
GFR calc Af Amer: >90 mL/min (ref >90)
GFR calc non Af Amer: 88 mL/min — ABNORMAL LOW (ref >90)
Glucose, Bld: 124 mg/dL — ABNORMAL HIGH (ref 70–99)
Potassium: 4.2 mEq/L (ref 3.7–5.3)
Sodium: 139 mEq/L (ref 137–147)

## 2013-11-14 MED ORDER — LEVOTHYROXINE SODIUM 50 MCG PO TABS
50.0000 ug | ORAL_TABLET | Freq: Every day | ORAL | Status: AC
Start: 2013-11-14 — End: ?

## 2013-11-14 NOTE — Progress Notes (Signed)
SATURATION QUALIFICATIONS: (This note is used to comply with regulatory documentation for home oxygen)  Patient Saturations on Room Air at Rest = *97**%  Patient Saturations on Room Air while Ambulating = 85**%    Please briefly explain why patient needs home oxygen: to maintain appropriate SaO2 levels.

## 2013-11-14 NOTE — Progress Notes (Signed)
CARE MANAGEMENT NOTE 11/14/2013  Patient:  Isabel Roberts,Isabel Roberts   Account Number:  [id number]  Date Initiated:  11/12/2013  Documentation initiated by:  Ezekiel InaMcGIBBONEY,COOKIE  Subjective/Objective Assessment:   pt admitted with SOB     Action/Plan:   from home   Anticipated DC Date:  11/16/2013   Anticipated DC Plan:  HOME/SELF CARE      DC Planning Services  CM consult      Choice offered to / List presented to:             Status of service:  In process, will continue to follow Medicare Important Message given?  NA - LOS <3 / Initial given by admissions (If response is "NO", the following Medicare IM given date fields will be blank) Date Medicare IM given:   Medicare IM given by:   Date Additional Medicare IM given:   Additional Medicare IM given by:    Discharge Disposition:    Per UR Regulation:  Reviewed for med. necessity/level of care/duration of stay  If discussed at Long Length of Stay Meetings, dates discussed:    Comments:  11/14/2013 1230  CPAP orders for DME to adjust at home. NCM spoke to pt. She reports having DME with [facility] or [facility]. NCM contacted both DME providers and they did not provide DME. Pt will have family check equipment at home to get the name. Isabel DonningAlesia Oluwatamilore Starnes RN CCM Case Mgmt phone [phone]  11/13/13 MMcGibboney, RN, BSN Spoke with pt concerning home health. Pt [facility], [facility]  Pt asked for DME Bariatric  bedside commode. There are no other needs at present time.  11/12/13 MMcGibboney, RN, BSN Chart reviewed.

## 2013-11-14 NOTE — Discharge Summary (Addendum)
Physician Discharge Summary  Isabel Roberts ZOX:[id number] DOB: 11/12/1980 DOA: 11/11/2013  PCP: [name], MD  Admit date: 11/11/2013 Discharge date: 11/14/2013  Time spent: 40 minutes  Recommendations for Outpatient Follow-up:  1. Followup with primary care physician will await.  Discharge Diagnoses:  Principal Problem:   Acute respiratory failure Active Problems:   Morbid obesity   OSA (obstructive sleep apnea)   Acute diastolic CHF (congestive heart failure), NYHA class 3   Acute bronchitis   Discharge Condition: Stable  Diet recommendation: Heart healthy diet  Filed Weights   11/11/13 2120 11/13/13 0445 11/14/13 0643  Weight: 340.198 kg (750 lb) 368.321 kg (812 lb) 364.238 kg (803 lb)    History of present illness:  Pt is 33 yo female, morbidly obese with underlying diastolic CHF per last 2 D ECHO in 2014 but very difficult study due to body habitus, now presenting to [facility] ED with main concern of several days duration of progressively worsening shortness of breath at rest and exertional, associated with mid sternal area chest tightness. Pt explain she has used albuterol inhaler but her symptoms persisted and she came to the ED. She also explains that this has been associated with several episodes of non bloody vomiting but she denies fevers, chills, abdominal pain , no urinary concerns. She denies similar events in the past.  In ED, pt noted to be hemodynamically stable but with oxygen saturation in mid 80's on RA. TRH asked to admit for further evaluation. Telemetry bed requested.  Hospital Course:   Acute hypoxic respiratory failure  this appears to be multifactorial and secondary to OHS with ? Diastolic CHF (difficult to assess due to body habitus)  admitted to telemetry unit  Place on oxygen, BD's scheduled and as needed  Check 2-D echo, BNP is normal.  V/Q scan initially she was ordered because of chest heaviness, discontinued later because her chest pain resolved  after the diuresis. Patient reports his greenish sputum will start on antibiotic, and continue diuresis.  Acute on chronic diastolic CHF  Difficult to assess exact volume status due to body habitus  Patient started on 60 mg of Lasix twice a day, later increased to 3 times a day. Successful diuresis with removal of more than 30 L of fluids according to the intake/output Obtaining weight might not be very reliable as her weight is more than the limit for our scales. Patient back to her baseline, no shortness of breath. Discharged home on 60 mg of Lasix twice a day, that was her home dose. Patient reported that she was taking only 40 mg a day before.   Morbid obesity  Patient counseled extensively about weight loss. Educated again and again about health hazards associated with morbid density.  Vomiting  Unclear etiology  provide antiemetics as needed  advance diet as pt able to tolerate, this is resolved.   Chest pain  Monitor on telemetry.  3 sets of cardiac enzymes are negative, VQ scan initially ordered, canceled   Obesity hypoventilation syndrome  oxygen provided   Severe malnutrition  secondary to severe morbid obesity     Procedures:  None  Consultations:  None  Discharge Exam: Filed Vitals:   11/14/13 0721  BP: 113/61  Pulse:   Temp:   Resp:    General: Alert and awake, oriented x3, not in any acute distress. Morbidly obese African American female HEENT: anicteric sclera, pupils reactive to light and accommodation, EOMI CVS: S1-S2 clear, no murmur rubs or gallops Chest: clear to auscultation  bilaterally, no wheezing, rales or rhonchi Abdomen: soft nontender, nondistended, normal bowel sounds, no organomegaly Extremities: no cyanosis, clubbing or edema noted bilaterally Neuro: Cranial nerves II-XII intact, no focal neurological deficits, gait was not tested   Discharge Instructions You were cared for by a hospitalist during your hospital stay. If you have  any questions about your discharge medications or the care you received while you were in the hospital after you are discharged, you can call the unit and asked to speak with the hospitalist on call if the hospitalist that took care of you is not available. Once you are discharged, your primary care physician will handle any further medical issues. Please note that NO REFILLS for any discharge medications will be authorized once you are discharged, as it is imperative that you return to your primary care physician (or establish a relationship with a primary care physician if you do not have one) for your aftercare needs so that they can reassess your need for medications and monitor your lab values.  Discharge Instructions   Diet - low sodium heart healthy    Complete by:  As directed      Increase activity slowly    Complete by:  As directed             Medication List         albuterol 108 (90 BASE) MCG/ACT inhaler  Commonly known as:  PROVENTIL HFA;VENTOLIN HFA  Inhale 2 puffs into the lungs daily as needed for wheezing or shortness of breath.     aspirin-acetaminophen-caffeine 250-250-65 MG per tablet  Commonly known as:  EXCEDRIN MIGRAINE  Take 2 tablets by mouth every 6 (six) hours as needed for headache.     ferrous sulfate 325 (65 FE) MG tablet  Take 325 mg by mouth 3 (three) times daily with meals.     furosemide 40 MG tablet  Commonly known as:  LASIX  Take 1.5 tablets (60 mg total) by mouth 2 (two) times daily.     ibuprofen 200 MG tablet  Commonly known as:  ADVIL,MOTRIN  Take 600 mg by mouth every 6 (six) hours as needed for moderate pain.     levothyroxine 50 MCG tablet  Commonly known as:  SYNTHROID, LEVOTHROID  Take 1 tablet (50 mcg total) by mouth daily before breakfast.     norethindrone 5 MG tablet  Commonly known as:  AYGESTIN  Take 5 mg by mouth daily.     Vitamin D (Ergocalciferol) 50000 UNITS Caps capsule  Commonly known as:  DRISDOL  Take 50,000  Units by mouth every 7 (seven) days. Tuesdays     VITAMIN E PO  Take 1 capsule by mouth daily.       Allergies  Allergen Reactions  . Shrimp [Shellfish Allergy] Anaphylaxis  . Fish Allergy Hives and Other (See Comments)    Throat swells  . Other Other (See Comments)    Grass causes a rash      The results of significant diagnostics from this hospitalization (including imaging, microbiology, ancillary and laboratory) are listed below for reference.    Significant Diagnostic Studies: Dg Chest 2 View  11/11/2013   CLINICAL DATA:  Shortness of breath and weakness.  EXAM: CHEST  2 VIEW  COMPARISON:  04/05/2013.  FINDINGS: Trachea is midline. Heart is enlarged. Lungs are grossly clear. No definite pleural fluid. Lateral view is nondiagnostic due to body habitus.  IMPRESSION: No definite acute findings.   Electronically Signed   By: Juliette Alcide  Blietz M.D.   On: 11/11/2013 17:28    Microbiology: No results found for this or any previous visit (from the past 240 hour(s)).   Labs: Basic Metabolic Panel:  Recent Labs Lab 11/11/13 1647 11/12/13 0109 11/13/13 0430 11/14/13 0447  NA 139 140 142 139  K 4.7 4.1 4.2 4.2  CL 101 102 98 95*  CO2 31 31 36* 34*  GLUCOSE 86 123* 108* 124*  BUN CREATININE 0.74 0.78 0.80 0.87  CALCIUM 8.9 8.8 9.5 9.6   Liver Function Tests:  Recent Labs Lab 11/11/13 1647  AST 11  ALT 14  ALKPHOS 130*  BILITOT 0.4  PROT 6.5  ALBUMIN 3.0*   No results found for this basename: LIPASE, AMYLASE,  in the last 168 hours No results found for this basename: AMMONIA,  in the last 168 hours CBC:  Recent Labs Lab 11/11/13 1647 11/12/13 0109  WBC 9.6 8.5  NEUTROABS 6.9  --   HGB 12.1 11.8*  HCT 41.8 40.2  MCV 90.5 88.7  PLT 260 248   Cardiac Enzymes:  Recent Labs Lab 11/11/13 1647 11/12/13 0109 11/12/13 0640 11/12/13 1354  TROPONINI <0.30 <0.30 <0.30 <0.30   BNP: BNP (last 3 results)  Recent Labs  12/16/12 1654  04/05/13 1523 11/11/13 2040  PROBNP 41.4 10.6 75.9   CBG:  Recent Labs Lab 11/12/13 0803  GLUCAP 115*       Signed:  Nnamdi Dacus A  [facility] 11/14/2013, 11:04 AM

## 2013-11-14 NOTE — Progress Notes (Signed)
1230: removed cath, was going to be discharged. 1600: discharge cancelled and Md states to reinsert cath. 1840: pt called me to room  And states cath is burning instructed since she had it out and then replaced it might do that. Readjusted cath still c/o it burning and hurting. Demanded it be removed , so cath removed.

## 2013-11-14 NOTE — Progress Notes (Signed)
Much improved.  IVT able to replace IV - lasix remains PO.  Foley was taken out on day shift per pt demands and has been able to void QS however does have some urge incontinence and correct output unattainable.  [facility] with only a standby assist.  Continues to have some BLE pain.  PP+ with negative Homan and only trace edema remains.

## 2013-11-14 NOTE — Evaluation (Addendum)
Physical Therapy Evaluation Patient Details Name: Isabel Roberts MRN: [id number] DOB: Sep 23, 1980 Today's Date: 11/14/2013   History of Present Illness  Pt is 33 yo female, morbidly obese with underlying diastolic CHF per last 2 D ECHO in 2014 but very difficult study due to body habitus, now [facility] with main concern of several days duration of progressively worsening shortness of breath at rest and exertional, associated with mid sternal area chest tightness.  Clinical Impression  *Pt admitted with *SOB, CHF**. Pt currently with functional limitations due to the deficits listed below (see PT Problem List).  Pt will benefit from skilled PT to increase their independence and safety with mobility to allow discharge to the venue listed below.   Pt lives alone and was independent at home with RW, walking short distances, an aide comes 3x/week. Today pt walked 7' x 3 trials with RW. Her SaO2 dropped to 85% on RA walking, HR 125. Pt will need to be able to walk approx 25' to get to her commode at home. She required assist for sit to supine.  Discussed possibility of ST-SNF with pt, she stated she's agreeable to considering this.   **    Follow Up Recommendations ST-SNF  -Supervision for mobility/OOB;Supervision - Intermittent (pt currently needs assist for bathing and for mobility, she's not able to walk far enough to reach her BSC at home but very much wants to DC home)    Equipment Recommendations  Wheelchair (measurements PT) (pt stated she's in processing of seeking medicaid approval for motorized WC)    Recommendations for Other Services OT consult     Precautions / Restrictions Precautions Precautions: Fall Restrictions Weight Bearing Restrictions: No      Mobility  Bed Mobility Overal bed mobility: + 2 for safety/equipment;+2 for physical assistance;Needs Assistance Bed Mobility: Sit to Supine           General bed mobility comments: assist to lift RLE into  bed  Transfers Overall transfer level: Needs assistance Equipment used: Rolling walker (2 wheeled) Transfers: Sit to/from Stand Sit to Stand: +2 physical assistance;+2 safety/equipment (+3 for safety)         General transfer comment: sit to stand from bed and from bariatric recliner, pt uses momentum to rise  Ambulation/Gait   Ambulation Distance (Feet): 7 Feet (7' x 3 trials, seated rest break btwn trials 2* SOB) Assistive device: Rolling walker (2 wheeled) (bariatric) Gait Pattern/deviations: Step-to pattern;Decreased step length - right;Decreased step length - left   Gait velocity interpretation: Below normal speed for age/gender General Gait Details: increased lateral trunk shift to unweight advancing leg. SaO2 85% on RA with walking, HR 125, distance limited by SOB  Stairs            Wheelchair Mobility    Modified Rankin (Stroke Patients Only)       Balance Overall balance assessment: History of Falls;Needs assistance   Sitting balance-Leahy Scale: Good       Standing balance-Leahy Scale: Poor Standing balance comment: needs BUE support on RW                             Pertinent Vitals/Pain Pain Assessment: No/denies pain    Home Living Family/patient expects to be discharged to:: Private residence Living Arrangements: Alone Available Help at Discharge: Home health   Home Access: Stairs to enter   Entrance Stairs-Number of Steps: 1 Home Layout: One level Home Equipment: Walker - 2  wheels;Shower seat;Bedside commode      Prior Function Level of Independence: Needs assistance   Gait / Transfers Assistance Needed: uses RW for short distances in home  ADL's / Homemaking Assistance Needed: aide comes 3x/week        Hand Dominance   Dominant Hand: Right    Extremity/Trunk Assessment   Upper Extremity Assessment: Overall WFL for tasks assessed           Lower Extremity Assessment: Overall WFL for tasks assessed       Cervical / Trunk Assessment: Normal  Communication   Communication: No difficulties  Cognition Arousal/Alertness: Awake/alert Behavior During Therapy: WFL for tasks assessed/performed Overall Cognitive Status: Within Functional Limits for tasks assessed                      General Comments      Exercises        Assessment/Plan    PT Assessment Patient needs continued PT services  PT Diagnosis Difficulty walking;Generalized weakness   PT Problem List Decreased strength;Decreased activity tolerance;Decreased balance;Decreased mobility;Decreased safety awareness  PT Treatment Interventions Gait training;Functional mobility training;Therapeutic activities;Patient/family education;Balance training;Therapeutic exercise   PT Goals (Current goals can be found in the Care Plan section) Acute Rehab PT Goals Patient Stated Goal: return home and be independent there PT Goal Formulation: With patient Time For Goal Achievement: 11/28/13 Potential to Achieve Goals: Fair    Frequency Min 3X/week   Barriers to discharge        Co-evaluation               End of Session   Activity Tolerance: Patient limited by fatigue Patient left: in bed Nurse Communication: Mobility status         Time: 1610-9604 PT Time Calculation (min): 50 min   Charges:   PT Evaluation $Initial PT Evaluation Tier I: 1 Procedure PT Treatments $Gait Training: 8-22 mins $Therapeutic Activity: 23-37 mins   PT G Codes:          Tamala Ser 11/14/2013, 12:51 PM [phone]

## 2013-11-15 DIAGNOSIS — R609 Edema, unspecified: Secondary | ICD-10-CM

## 2013-11-15 DIAGNOSIS — R791 Abnormal coagulation profile: Secondary | ICD-10-CM

## 2013-11-15 LAB — URINE MICROSCOPIC-ADD ON

## 2013-11-15 LAB — URINALYSIS, ROUTINE W REFLEX MICROSCOPIC
Bilirubin Urine: NEGATIVE
Glucose, UA: NEGATIVE mg/dL
Hgb urine dipstick: NEGATIVE
Ketones, ur: NEGATIVE mg/dL
NITRITE: NEGATIVE
PH: 8.5 — AB (ref 5.0–8.0)
Protein, ur: NEGATIVE mg/dL
SPECIFIC GRAVITY, URINE: 1.016 (ref 1.005–1.030)
Urobilinogen, UA: 1 mg/dL (ref 0.0–1.0)

## 2013-11-15 LAB — BASIC METABOLIC PANEL
Anion gap: 12 (ref 5–15)
BUN: 13 mg/dL (ref 6–23)
CO2: 32 mEq/L (ref 19–32)
Calcium: 9.6 mg/dL (ref 8.4–10.5)
Chloride: 94 mEq/L — ABNORMAL LOW (ref 96–112)
Creatinine, Ser: 0.89 mg/dL (ref 0.50–1.10)
GFR calc Af Amer: >90 mL/min (ref >90)
GFR, EST NON AFRICAN AMERICAN: 85 mL/min — AB (ref >90)
Glucose, Bld: 105 mg/dL — ABNORMAL HIGH (ref 70–99)
POTASSIUM: 4.2 meq/L (ref 3.7–5.3)
Sodium: 138 mEq/L (ref 137–147)

## 2013-11-15 MED ORDER — LIDOCAINE HCL 2 % EX GEL
1.0000 "application " | Freq: Once | CUTANEOUS | Status: AC
  Administered 2013-11-15: 1 via URETHRAL
  Filled 2013-11-15: qty 5

## 2013-11-15 MED ORDER — METOLAZONE 5 MG PO TABS
5.0000 mg | ORAL_TABLET | Freq: Once | ORAL | Status: AC
  Administered 2013-11-15: 5 mg via ORAL
  Filled 2013-11-15: qty 1

## 2013-11-15 MED ORDER — FUROSEMIDE 10 MG/ML IJ SOLN
40.0000 mg | Freq: Three times a day (TID) | INTRAMUSCULAR | Status: DC
  Administered 2013-11-15 – 2013-11-16 (×3): 40 mg via INTRAVENOUS
  Filled 2013-11-15 (×7): qty 4

## 2013-11-15 NOTE — Progress Notes (Signed)
CARE MANAGEMENT NOTE 11/15/2013  Patient:  Isabel Roberts,Isabel Roberts   Account Number:  [id number]  Date Initiated:  11/12/2013  Documentation initiated by:  Isabel Roberts  Subjective/Objective Assessment:   pt admitted with SOB     Action/Plan:   from home   Anticipated DC Date:  11/16/2013   Anticipated DC Plan:  HOME/SELF CARE      DC Planning Services  CM consult      Choice offered to / List presented to:             Status of service:  Completed, signed off Medicare Important Message given?  NA - LOS <3 / Initial given by admissions (If response is "NO", the following Medicare IM given date fields will be blank) Date Medicare IM given:   Medicare IM given by:   Date Additional Medicare IM given:   Additional Medicare IM given by:    Discharge Disposition:    Per UR Regulation:  Reviewed for med. necessity/level of care/duration of stay  If discussed at Long Length of Stay Meetings, dates discussed:    Comments:  11/15/2013 1200 NCM spoke to pt and she has [facility], [phone] fax [phone]. Contacted [facility] and spoke to rep to make aware pt will need RT to come out at dc on 8/24 to check CPAP for function and/or to adjust settings. Faxed H&P, notes, and order for CPAP adjust to [facility]. Pt will discuss with her PCP on his next visit about getting therapeutic counseling at home. States has some stressors at this time she feels would benefit her to have counsel. Encouraged her to discuss with physician. Explained insurance coverage may not cover in home counseling. Msg for MD for [facility] RN at dc. Orders needed.  Waiting final dc recommendation for [facility] RN.  HH PT not covered without a qualifying dx. Isabel Roberts phone [phone]  11/14/2013 1230  CPAP orders for DME to adjust at home. NCM spoke to pt. She reports having DME with [facility] or [facility]. NCM contacted both DME providers and they did not provide DME. Pt will have family check equipment at home to get the  name. Isabel Roberts phone [phone]  11/13/13 MMcGibboney, RN, BSN Spoke with pt concerning home health. Pt states that she is active with [facility].  Pt asked for DME Bariatric  bedside commode. There are no other needs at present time.  11/12/13 MMcGibboney, RN, BSN Chart reviewed.

## 2013-11-15 NOTE — Progress Notes (Addendum)
[facility] PROGRESS NOTE   Isabel Roberts ZOX:[id number] DOB: 05-15-1980 DOA: 11/11/2013 PCP: [name], MD  HPI/Subjective: Plans for discharge yesterday postponed because of continuous hypoxia with ambulation. Diuresis switched to IV Lasix, added 1 dose of Zaroxolyn  Assessment/Plan: Principal Problem:   Acute respiratory failure Active Problems:   Morbid obesity   OSA (obstructive sleep apnea)   Acute diastolic CHF (congestive heart failure), NYHA class 3   Acute bronchitis   Acute hypoxic respiratory failure  this appears to be multifactorial and secondary to OHS with ? Diastolic CHF (difficult to assess due to body habitus)  admitted to telemetry unit  place on oxygen, BD's scheduled and as needed  Check 2-D echo, BNP is normal. V/Q scan ordered initially to rule out PE, cannot be done because of patient weight. I do not think this is PE, this is likely secondary to acute on chronic diastolic CHF, improved with diuresis. Patient reports his greenish sputum will start on antibiotic, and continue diuresis. Acute on chronic diastolic CHF  Difficult to assess exact volume status due to body habitus  Lasix switched to IV, Zaroxolyn added. Reinsert Foley for urine output monitoring. Morbid obesity  nutritionist consultation  Vomiting  unclear etiology  provide antiemetics as needed  advance diet as pt able to tolerate, this is resolved.  Chest pain  Monitor on telemetry.  3 sets of cardiac enzymes are negative. Contacted radiology both VQ scan and CT angio cannot be done because of patient's weight. Clinically I do not think this is PE as patient improving with diuresis. Obesity hypoventilation syndrome  oxygen provided  Severe malnutrition  secondary to severe morbid obesity    Code Status: Full code Family Communication: Plan discussed with the patient. Disposition Plan: Remains  inpatient   Consultants:  None  Procedures:  None  Antibiotics:  None   Objective: Filed Vitals:   11/15/13 0528  BP: 114/59  Pulse: 88  Temp: 98.6 F (37 C)  Resp: 18    Intake/Output Summary (Last 24 hours) at 11/15/13 1052 Last data filed at 11/15/13 0650  Gross per 24 hour  Intake   1680 ml  Output   3400 ml  Net  -1720 ml   Filed Weights   11/13/13 0445 11/14/13 0643 11/15/13 0649  Weight: 368.321 kg (812 lb) 364.238 kg (803 lb) 356.527 kg (786 lb)    Exam: General: Alert and awake, oriented x3, not in any acute distress. HEENT: anicteric sclera, pupils reactive to light and accommodation, EOMI CVS: S1-S2 clear, no murmur rubs or gallops Chest: clear to auscultation bilaterally, no wheezing, rales or rhonchi Abdomen: soft nontender, nondistended, normal bowel sounds, no organomegaly Extremities: no cyanosis, clubbing or edema noted bilaterally Neuro: Cranial nerves II-XII intact, no focal neurological deficits  Data Reviewed: Basic Metabolic Panel:  Recent Labs Lab 11/11/13 1647 11/12/13 0109 11/13/13 0430 11/14/13 0447 11/15/13 0549  NA 139 140 142 139 138  K 4.7 4.1 4.2 4.2 4.2  CL 101 102 98 95* 94*  CO2 31 31 36* 34* 32  GLUCOSE 86 123* 108* 124* 105*  BUN CREATININE 0.74 0.78 0.80 0.87 0.89  CALCIUM 8.9 8.8 9.5 9.6 9.6   Liver Function Tests:  Recent Labs Lab 11/11/13 1647  AST 11  ALT 14  ALKPHOS 130*  BILITOT 0.4  PROT 6.5  ALBUMIN 3.0*   No results found for this basename: LIPASE, AMYLASE,  in the last 168 hours No results found for this  basename: AMMONIA,  in the last 168 hours CBC:  Recent Labs Lab 11/11/13 1647 11/12/13 0109  WBC 9.6 8.5  NEUTROABS 6.9  --   HGB 12.1 11.8*  HCT 41.8 40.2  MCV 90.5 88.7  PLT 260 248   Cardiac Enzymes:  Recent Labs Lab 11/11/13 1647 11/12/13 0109 11/12/13 0640 11/12/13 1354  TROPONINI <0.30 <0.30 <0.30 <0.30   BNP (last 3 results)  Recent Labs   12/16/12 1654 04/05/13 1523 11/11/13 2040  PROBNP 41.4 10.6 75.9   CBG:  Recent Labs Lab 11/12/13 0803  GLUCAP 115*    Micro No results found for this or any previous visit (from the past 240 hour(s)).   Studies: No results found.  Scheduled Meds: . antiseptic oral rinse  7 mL Mouth Rinse q12n4p  . enoxaparin (LOVENOX) injection  150 mg Subcutaneous QHS  . ferrous sulfate  325 mg Oral TID WC  . furosemide  40 mg Intravenous 3 times per day  . guaiFENesin  1,200 mg Oral BID  . levofloxacin  750 mg Oral Daily  . levothyroxine  25 mcg Oral QAC breakfast  . lidocaine  1 application Urethral Once  . norethindrone  5 mg Oral Daily  . sodium chloride  3 mL Intravenous Q12H   Continuous Infusions:      Time spent: 35 minutes    [facility] A  [facility] Pager [phone] If 7PM-7AM, please contact night-coverage at [email or web address], password [facility] 11/15/2013, 10:52 AM  LOS: 4 days

## 2013-11-15 NOTE — Progress Notes (Signed)
Arrived to bedside, pt already wearing cpap for rest.  Pt self-administered mask and has no issues at this time.  Settings on autotitration mode 10-20cm h2o with 4l o2 bleedin.  Pt refused sterile water for humidity chamber.  Pt was advised that RT is available all night should she need further assistance.

## 2013-11-15 NOTE — Progress Notes (Signed)
Pt self-administered cpap for rest.  Pt is tolerating well with no complaints at this time.  Cpap is on automode 10-20cm h2o with 4l o2 bleedin.

## 2013-11-16 ENCOUNTER — Other Ambulatory Visit (HOSPITAL_COMMUNITY): Payer: Medicaid Other

## 2013-11-16 LAB — BASIC METABOLIC PANEL
Anion gap: 13 (ref 5–15)
BUN: 13 mg/dL (ref 6–23)
CO2: 35 mEq/L — ABNORMAL HIGH (ref 19–32)
CREATININE: 0.87 mg/dL (ref 0.50–1.10)
Calcium: 10.1 mg/dL (ref 8.4–10.5)
Chloride: 88 mEq/L — ABNORMAL LOW (ref 96–112)
GFR, EST NON AFRICAN AMERICAN: 88 mL/min — AB (ref >90)
Glucose, Bld: 119 mg/dL — ABNORMAL HIGH (ref 70–99)
Potassium: 3.5 mEq/L — ABNORMAL LOW (ref 3.7–5.3)
Sodium: 136 mEq/L — ABNORMAL LOW (ref 137–147)

## 2013-11-16 MED ORDER — BUMETANIDE 1 MG PO TABS: 1.0000 mg | ORAL_TABLET | Freq: Two times a day (BID) | ORAL | Status: AC

## 2013-11-16 MED ORDER — POTASSIUM CHLORIDE CRYS ER 20 MEQ PO TBCR
60.0000 meq | EXTENDED_RELEASE_TABLET | Freq: Once | ORAL | Status: AC
  Administered 2013-11-16: 60 meq via ORAL
  Filled 2013-11-16: qty 3

## 2013-11-16 NOTE — Progress Notes (Signed)
CSW consulted for transportation needs. Patient will need non-emergency ambulance transport home. CSW confirmed home address with patient.   [facility] called for transport.   No other CSW needs identified - CSW signing off.   Kaesen Rodriguez, LCSW [facility] Clinical Social Worker cell #: [phone]     

## 2013-11-16 NOTE — Progress Notes (Signed)
CARE MANAGEMENT NOTE 11/16/2013  Patient:  Isabel Roberts,Isabel Roberts   Account Number:  [id number]  Date Initiated:  11/12/2013  Documentation initiated by:  Ezekiel Ina  Subjective/Objective Assessment:   pt admitted with SOB     Action/Plan:   from home   Anticipated DC Date:  11/16/2013   Anticipated DC Plan:  HOME/SELF CARE      DC Planning Services  CM consult      [facility] Service Choice  HOME HEALTH   Choice offered to / List presented to:  C-1 Patient        HH arranged  HH-1 RN      [facility] agency  [facility]   Status of service:  Completed, signed off Medicare Important Message given?  NA - LOS <3 / Initial given by admissions (If response is "NO", the following Medicare IM given date fields will be blank) Date Medicare IM given:   Medicare IM given by:   Date Additional Medicare IM given:   Additional Medicare IM given by:    Discharge Disposition:  HOME W HOME HEALTH SERVICES  Per UR Regulation:  Reviewed for med. necessity/level of care/duration of stay  If discussed at Long Length of Stay Meetings, dates discussed:    Comments:  11/16/2013 1220 NCM spoke to PCP's RN and they faxed sleep study documentation to [facility]. Refaxed orders for adjustments to CPAP, and notes to [facility]. Explained to pt to contact [facility] and they will come out when she gets home. [name] RN CCM Case Mgmt phone [phone]  11/16/2013 1030 NCM received call from [facility] rep, Lupita Leash and she does not have CPAP with them. Pt does have oxygen with them. Pt states she received a CPAP about a year ago, it was a loaner CPAP from [employer]. NCM contacted [facility], spoke to [place]. She will research and get back with NCM on CPAP. [facility] will call PCP's office for sleep study documentation. [name] RN CCM [phone]  11/16/2013 1010 Received call from [facility] and they will follow up post dc. Will need pt to call when she arrives home. Gave DME agency approximate dc date of today. [name] RN CCM Case Mgmt phone [phone]  11/15/2013 1200 NCM spoke to pt and she has [facility], [phone] fax [phone]. Contacted [facility] and spoke to rep to make aware pt will need RT to come out at dc on 8/24 to check CPAP for function and/or to adjust settings. Faxed H&P, notes, and order for CPAP adjustment to [facility]. Pt will discuss with her PCP on his next visit about getting therapeutic counseling at home. States has some stressors at this time she feels would benefit her to have counsel. Encouraged her to discuss with physician. Explained insurance coverage may not cover in home counseling. Msg for MD for [facility] RN at dc. Orders needed. Waiting final dc recommendation for [facility] RN.  HH PT not covered without a qualifying dx. [name] RN CCM Case Mgmt phone [phone]  11/14/2013 1230  CPAP orders for DME to adjust at home. NCM spoke to pt. She reports having DME with [facility] or [facility]. NCM contacted both DME providers and they did not provide DME. Pt will have family check equipment at home to get the name. [name] RN CCM Case Mgmt phone [phone]  11/13/13 MMcGibboney, RN, BSN Spoke with pt concerning home health. Pt states that she is active with [facility], [facility].  Pt asked for DME Bariatric  bedside commode. There are no other needs at  present time.  11/12/13 MMcGibboney, RN, BSN Chart reviewed.

## 2013-11-16 NOTE — Discharge Summary (Addendum)
Physician Discharge Summary  Mackensi Mahadeo ZOX:[id number] DOB: 07-Apr-1980 DOA: 11/11/2013  PCP: [name], MD  Admit date: 11/11/2013 Discharge date: 11/16/2013  Time spent: 40 minutes  Recommendations for Outpatient Follow-up:  1. Followup with primary care physician next week. Check BMP within one week for renal function/potassium. 2. Increase CPAP pressure to auto titration 11-15 cm of water. 3. If patient admitted to the hospital again, recommend aggressive diuresis with IV Lasix (at least 40 mg 3 times a day).  Discharge Diagnoses:  Principal Problem:   Acute respiratory failure Active Problems:   Morbid obesity   OSA (obstructive sleep apnea)   Acute diastolic CHF (congestive heart failure), NYHA class 3   Acute bronchitis   Discharge Condition: Stable  Diet recommendation: Heart healthy diet  Filed Weights   11/14/13 0643 11/15/13 0649 11/16/13 0523  Weight: 364.238 kg (803 lb) 356.527 kg (786 lb) 354.259 kg (781 lb)    History of present illness:  Pt is 33 yo female, morbidly obese with underlying diastolic CHF per last 2 D ECHO in 2014 but very difficult study due to body habitus, now presenting to [facility] ED with main concern of several days duration of progressively worsening shortness of breath at rest and exertional, associated with mid sternal area chest tightness. Pt explain she has used albuterol inhaler but her symptoms persisted and she came to the ED. She also explains that this has been associated with several episodes of non bloody vomiting but she denies fevers, chills, abdominal pain , no urinary concerns. She denies similar events in the past.  In ED, pt noted to be hemodynamically stable but with oxygen saturation in mid 80's on RA. [facility] asked to admit for further evaluation. Telemetry bed requested.  Hospital Course:   Acute hypoxic respiratory failure  this appears to be multifactorial and secondary to OHS with ? Diastolic CHF (difficult to assess due to  body habitus)  admitted to telemetry unit  Place on oxygen, BD's scheduled and as needed  Check 2-D echo, BNP is normal.  V/Q scan initially she was ordered because of chest heaviness, discontinued later because her chest pain resolved after the diuresis. Aggressive diuresis with oral and IV Lasix, patient feels much better. Successful removal of 44.3 L of fluids, the net intake/output and -38.8 for the past 5 days. (Urine calculated through Foley catheter)  Acute on chronic diastolic CHF  Difficult to assess exact volume status due to body habitus  Patient started on 60 mg of Lasix twice a day, later increased to 3 times a day. Obtaining weight might not be very reliable as her weight is more than the limit for our scales. Patient back to her baseline, no shortness of breath. Massive fluid overload, as mentioned above net intake/output -38.8 L Discharged home on 1 mg of Bumex twice a day  Morbid obesity  Patient counseled extensively about weight loss. Educated again and again about health hazards associated with morbid density.  Vomiting  Unclear etiology  provide antiemetics as needed  advance diet as pt able to tolerate, this is resolved.   Chest pain  Monitor on telemetry.  3 sets of cardiac enzymes are negative, VQ scan initially ordered, canceled   OSA/OHS Patient is on CPAP at night, she reported CPAP setting to be at 4 cm of water. In the hospital O2 titration always CPAP pressure will be around 10.5-11.5. I have asked home health services to increase her  Severe malnutrition  secondary to severe morbid obesity  Elevated TSH  TSH found to be slightly elevated at 5.570, patient started on 50 mcg of Synthroid.  Check TSH in 4 weeks.  Procedures:  None  Consultations:  None  Discharge Exam: Filed Vitals:   11/16/13 0644  BP: 118/72  Pulse: 91  Temp: 98.2 F (36.8 C)  Resp: 18   General: Alert and awake, oriented x3, not in any acute distress.  Morbidly obese African American female HEENT: anicteric sclera, pupils reactive to light and accommodation, EOMI CVS: S1-S2 clear, no murmur rubs or gallops Chest: clear to auscultation bilaterally, no wheezing, rales or rhonchi Abdomen: soft nontender, nondistended, normal bowel sounds, no organomegaly Extremities: no cyanosis, clubbing or edema noted bilaterally Neuro: Cranial nerves II-XII intact, no focal neurological deficits, gait was not tested   Discharge Instructions You were cared for by a hospitalist during your hospital stay. If you have any questions about your discharge medications or the care you received while you were in the hospital after you are discharged, you can call the unit and asked to speak with the hospitalist on call if the hospitalist that took care of you is not available. Once you are discharged, your primary care physician will handle any further medical issues. Please note that NO REFILLS for any discharge medications will be authorized once you are discharged, as it is imperative that you return to your primary care physician (or establish a relationship with a primary care physician if you do not have one) for your aftercare needs so that they can reassess your need for medications and monitor your lab values.      Discharge Instructions   Diet - low sodium heart healthy    Complete by:  As directed      Increase activity slowly    Complete by:  As directed             Medication List    STOP taking these medications       furosemide 40 MG tablet  Commonly known as:  LASIX      TAKE these medications       albuterol 108 (90 BASE) MCG/ACT inhaler  Commonly known as:  PROVENTIL HFA;VENTOLIN HFA  Inhale 2 puffs into the lungs daily as needed for wheezing or shortness of breath.     aspirin-acetaminophen-caffeine 250-250-65 MG per tablet  Commonly known as:  EXCEDRIN MIGRAINE  Take 2 tablets by mouth every 6 (six) hours as needed for headache.      bumetanide 1 MG tablet  Commonly known as:  BUMEX  Take 1 tablet (1 mg total) by mouth 2 (two) times daily.     ferrous sulfate 325 (65 FE) MG tablet  Take 325 mg by mouth 3 (three) times daily with meals.     ibuprofen 200 MG tablet  Commonly known as:  ADVIL,MOTRIN  Take 600 mg by mouth every 6 (six) hours as needed for moderate pain.     levothyroxine 50 MCG tablet  Commonly known as:  SYNTHROID, LEVOTHROID  Take 1 tablet (50 mcg total) by mouth daily before breakfast.     norethindrone 5 MG tablet  Commonly known as:  AYGESTIN  Take 5 mg by mouth daily.     Vitamin D (Ergocalciferol) 50000 UNITS Caps capsule  Commonly known as:  DRISDOL  Take 50,000 Units by mouth every 7 (seven) days. Tuesdays     VITAMIN E PO  Take 1 capsule by mouth daily.       Allergies  Allergen Reactions  . Shrimp [Shellfish Allergy] Anaphylaxis  . Fish Allergy Hives and Other (See Comments)    Throat swells  . Other Other (See Comments)    Grass causes a rash      The results of significant diagnostics from this hospitalization (including imaging, microbiology, ancillary and laboratory) are listed below for reference.    Significant Diagnostic Studies: Dg Chest 2 View  11/11/2013   CLINICAL DATA:  Shortness of breath and weakness.  EXAM: CHEST  2 VIEW  COMPARISON:  04/05/2013.  FINDINGS: Trachea is midline. Heart is enlarged. Lungs are grossly clear. No definite pleural fluid. Lateral view is nondiagnostic due to body habitus.  IMPRESSION: No definite acute findings.   Electronically Signed   By: Leanna Battles M.D.   On: 11/11/2013 17:28    Microbiology: No results found for this or any previous visit (from the past 240 hour(s)).   Labs: Basic Metabolic Panel:  Recent Labs Lab 11/12/13 0109 11/13/13 0430 11/14/13 0447 11/15/13 0549 11/16/13 0900  NA 140 142 139 138 136*  K 4.1 4.2 4.2 4.2 3.5*  CL 102 98 95* 94* 88*  CO2 31 36* 34* 32 35*  GLUCOSE 123* 108* 124* 105*  119*  BUN CREATININE 0.78 0.80 0.87 0.89 0.87  CALCIUM 8.8 9.5 9.6 9.6 10.1   Liver Function Tests:  Recent Labs Lab 11/11/13 1647  AST 11  ALT 14  ALKPHOS 130*  BILITOT 0.4  PROT 6.5  ALBUMIN 3.0*   No results found for this basename: LIPASE, AMYLASE,  in the last 168 hours No results found for this basename: AMMONIA,  in the last 168 hours CBC:  Recent Labs Lab 11/11/13 1647 11/12/13 0109  WBC 9.6 8.5  NEUTROABS 6.9  --   HGB 12.1 11.8*  HCT 41.8 40.2  MCV 90.5 88.7  PLT 260 248   Cardiac Enzymes:  Recent Labs Lab 11/11/13 1647 11/12/13 0109 11/12/13 0640 11/12/13 1354  TROPONINI <0.30 <0.30 <0.30 <0.30   BNP: BNP (last 3 results)  Recent Labs  12/16/12 1654 04/05/13 1523 11/11/13 2040  PROBNP 41.4 10.6 75.9   CBG:  Recent Labs Lab 11/12/13 0803  GLUCAP 115*       Signed:  Joell Usman A  [facility] 11/16/2013, 12:31 PM

## 2013-11-16 NOTE — Progress Notes (Signed)
11/16/2013 1010 Received call from [facility] and they will follow up post dc. Will need pt to call when she arrives home. Gave DME agency approximate dc date of today. [name] RN CCM Case Mgmt phone [phone]

## 2013-11-25 NOTE — ED Provider Notes (Signed)
Medical screening examination/treatment/procedure(s) were performed by non-physician practitioner and as supervising physician I was immediately available for consultation/collaboration.   EKG Interpretation   Date/Time:  Wednesday November 11 2013 19:16:41 EDT Ventricular Rate:  78 PR Interval:  165 QRS Duration: 83 QT Interval:  394 QTC Calculation: 449 R Axis:   87 Text Interpretation:  Sinus rhythm Low voltage, precordial leads ED  PHYSICIAN INTERPRETATION AVAILABLE IN [facility] Confirmed by TEST,  Record ([id number]) on 11/13/2013 8:19:59 AM       Raeford Razor, MD 11/25/13 1222

## 2014-01-25 ENCOUNTER — Encounter (HOSPITAL_COMMUNITY): Payer: Self-pay | Admitting: Emergency Medicine

## 2014-09-25 IMAGING — CR DG CHEST 1V PORT
1 series · 2 of 2 positions shown · non-contrast
Comparison: December 07, 2012

CLINICAL DATA: Shortness of breath

EXAM:
PORTABLE CHEST - 1 VIEW

[Series 1: AP · U · 2 of 2 slices shown]
[im 1/2]
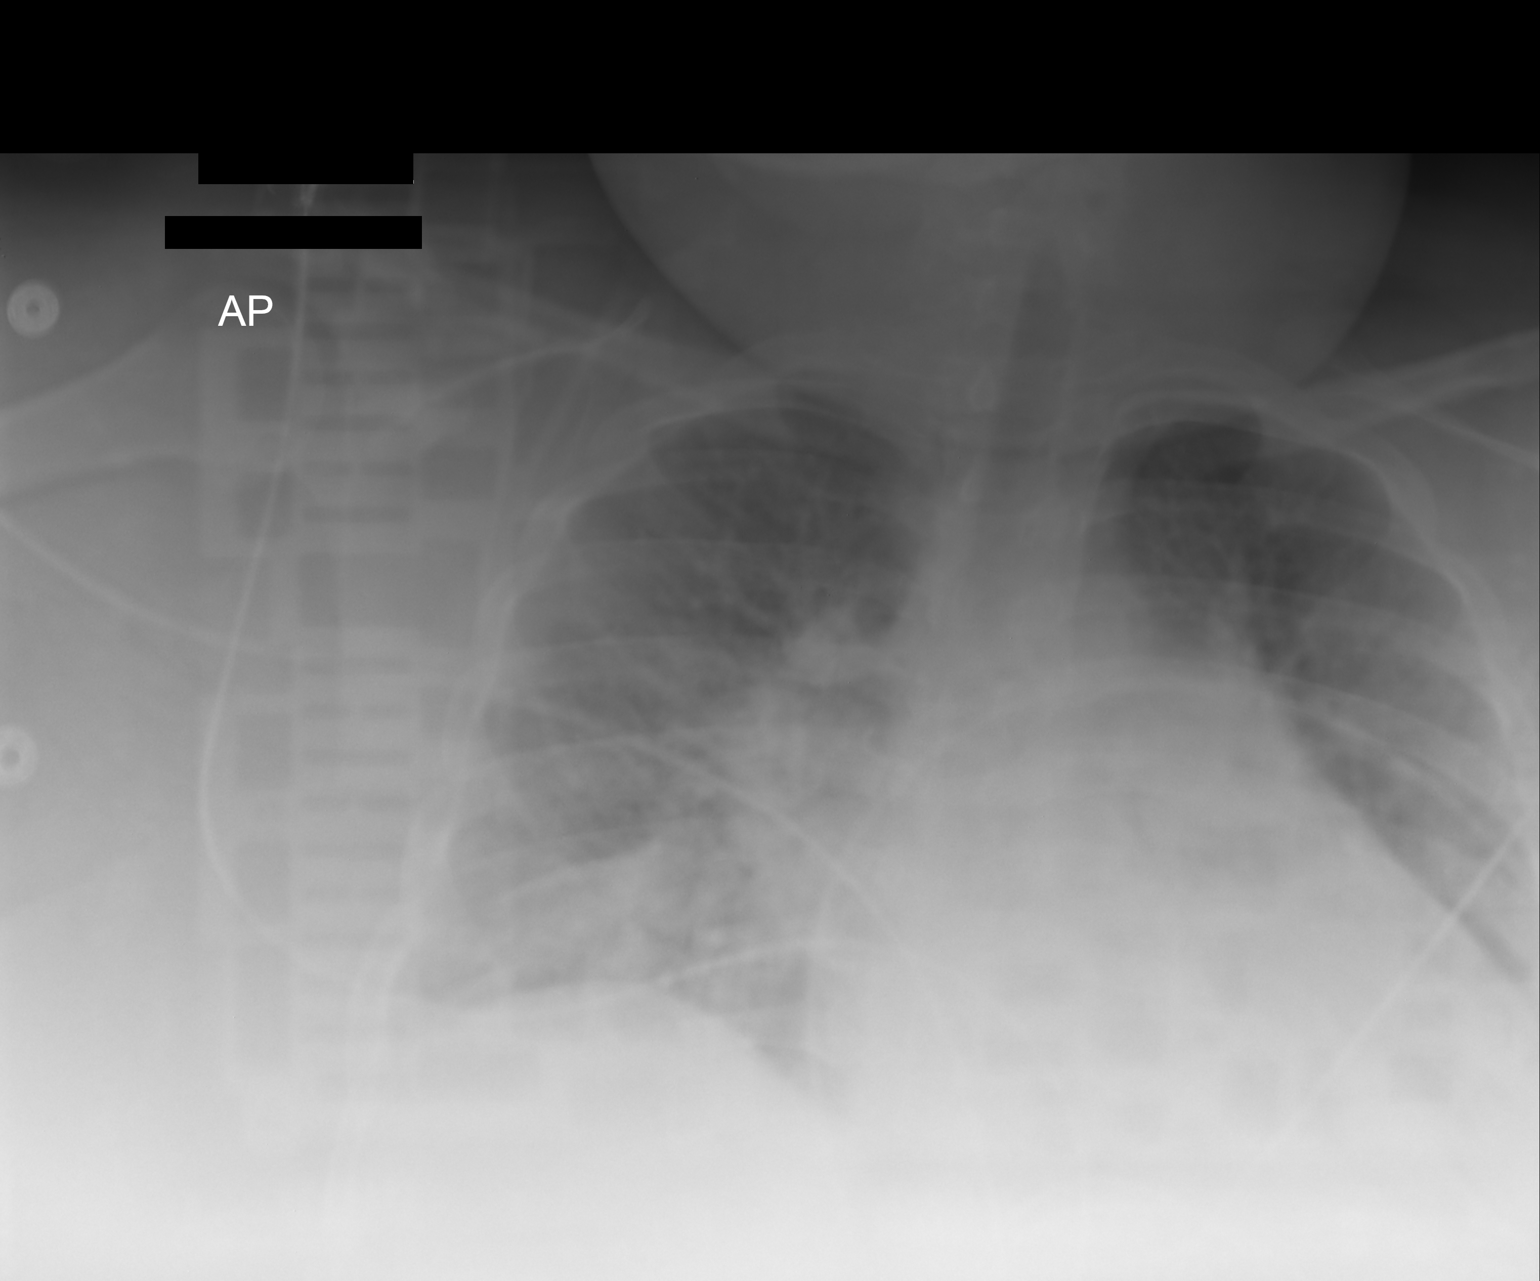
[im 2/2]
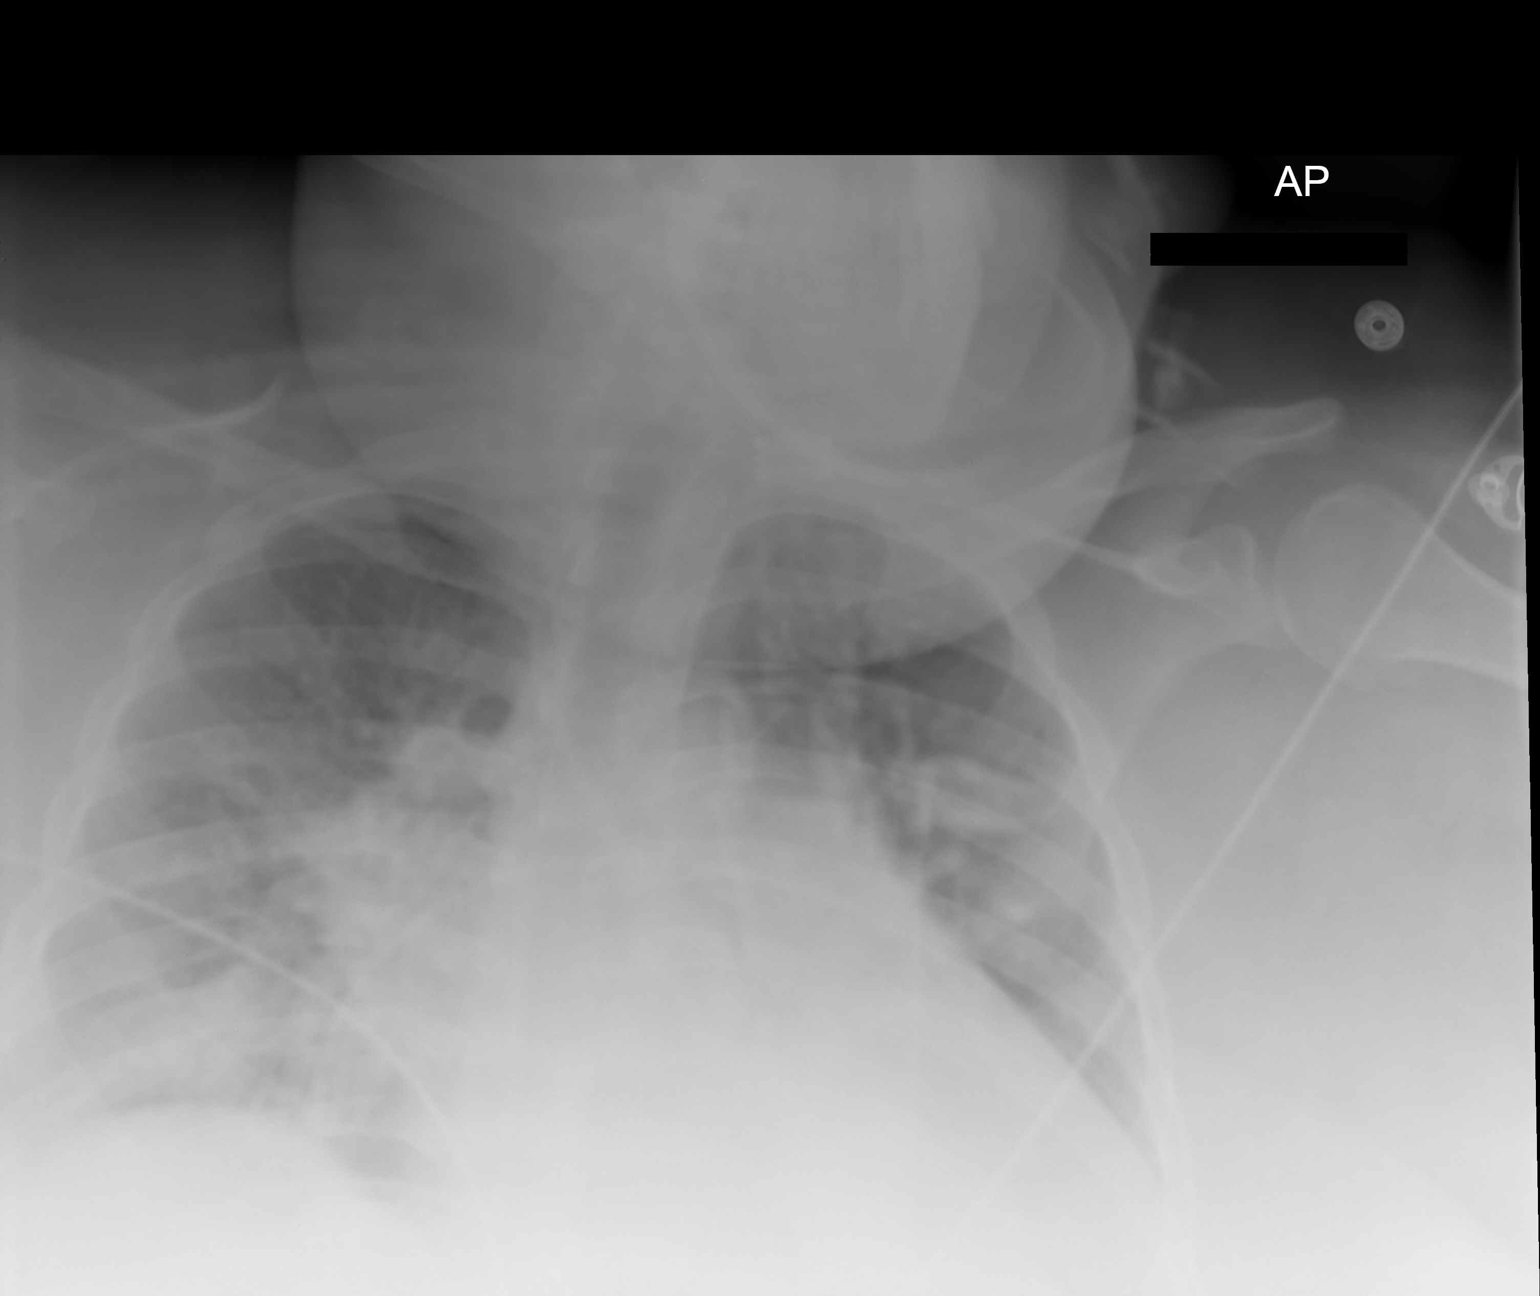

[2 of 2 positions shown; findings below may reference images not displayed]

FINDINGS: There is pulmonary edema. There is consolidation of right lung base,
superimposed pneumonia is not excluded. The heart size is enlarged.
Mediastinal contour is enlarged. The exam is limited by patient
motion.
IMPRESSION: Congestive heart failure. Patchy consolidation of right lung base
superimposed pneumonia is not excluded.

## 2015-01-13 IMAGING — CR DG CHEST 1V PORT
1 series · 1 of 1 positions shown · non-contrast
Comparison: 12/16/2012

CLINICAL DATA: Shortness of breath

EXAM:
PORTABLE CHEST - 1 VIEW

[AP]
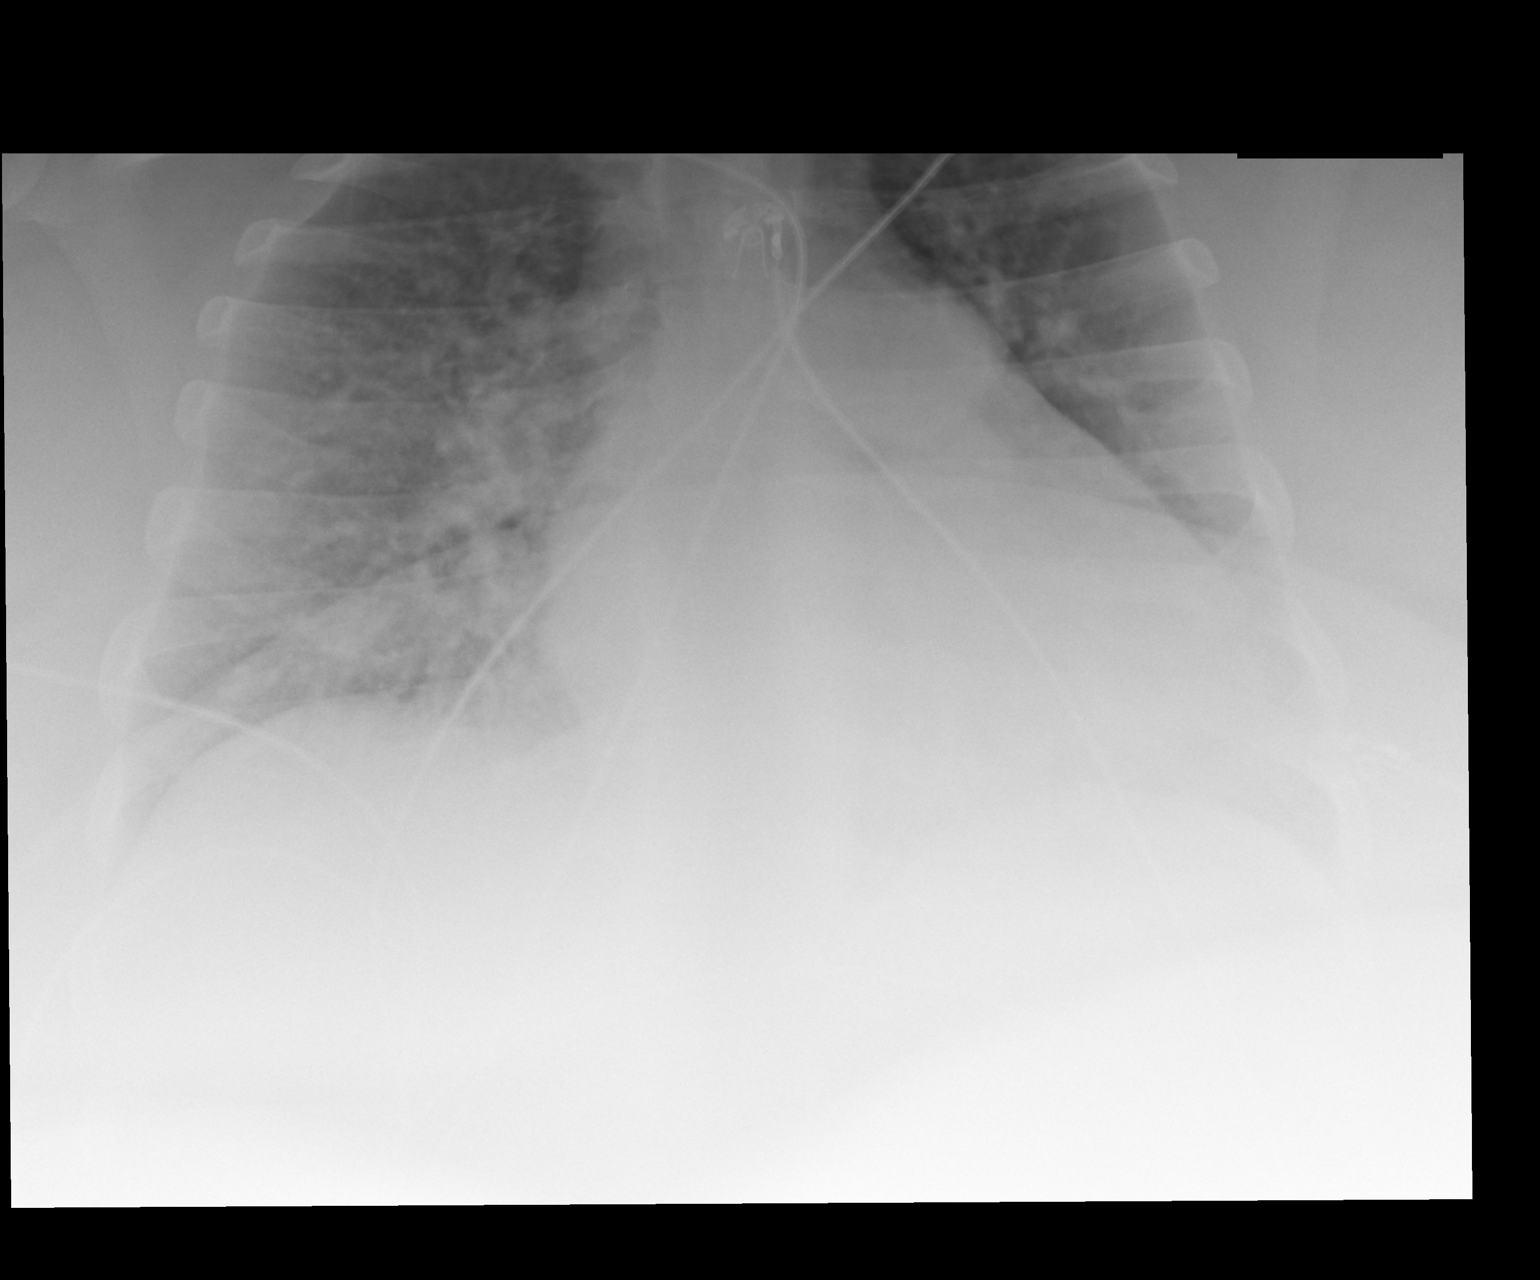

[1 of 1 positions shown; findings below may reference images not displayed]

FINDINGS: Cardiac shadow is enlarged. Pulmonary vascular congestion is again
noted similar to that seen on the prior exam. No focal infiltrate or
acute bony abnormality is noted.
IMPRESSION: Congestive failure.

## 2015-08-21 IMAGING — CR DG CHEST 2V
4 series · 4 of 4 positions shown · non-contrast
Comparison: 04/05/2013.

CLINICAL DATA: Shortness of breath and weakness.

EXAM:
CHEST  2 VIEW

[w chest lat (1 of 2)]
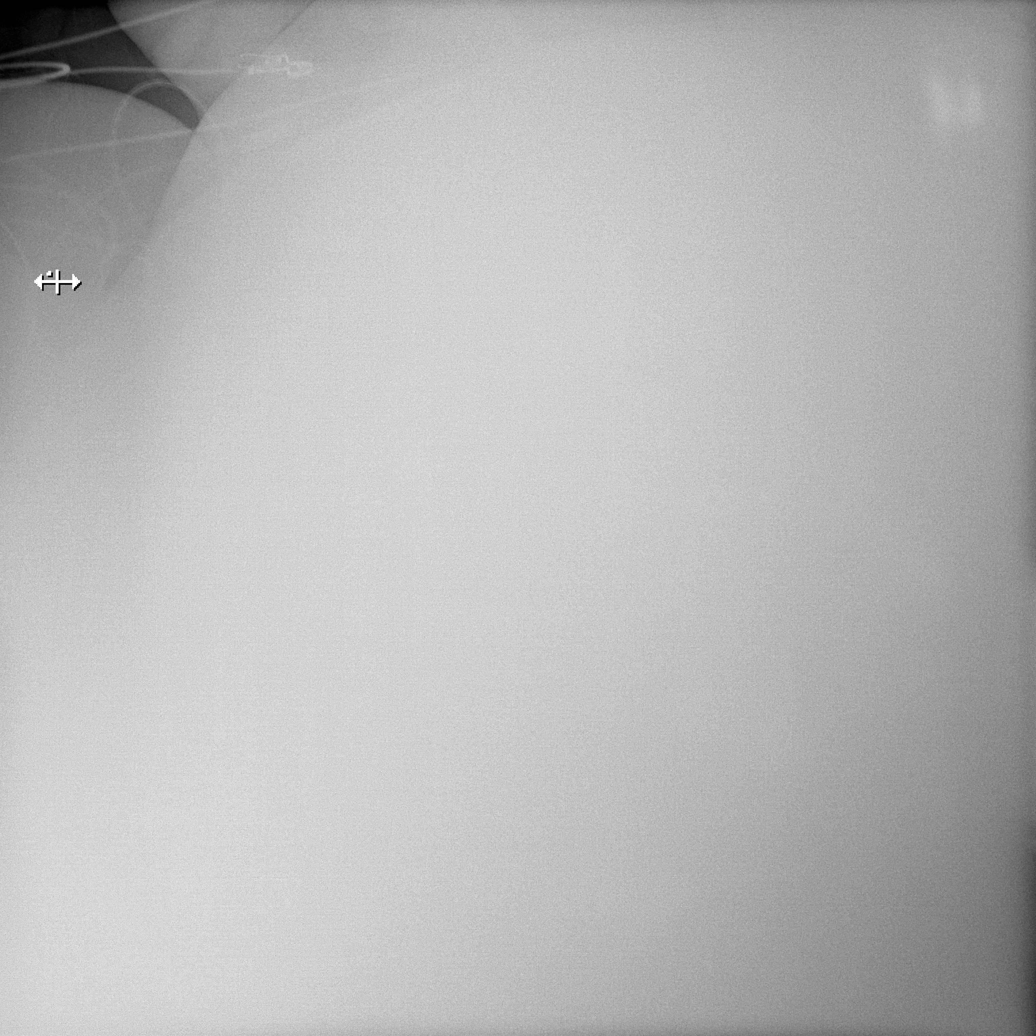

[w chest lat (2 of 2)]
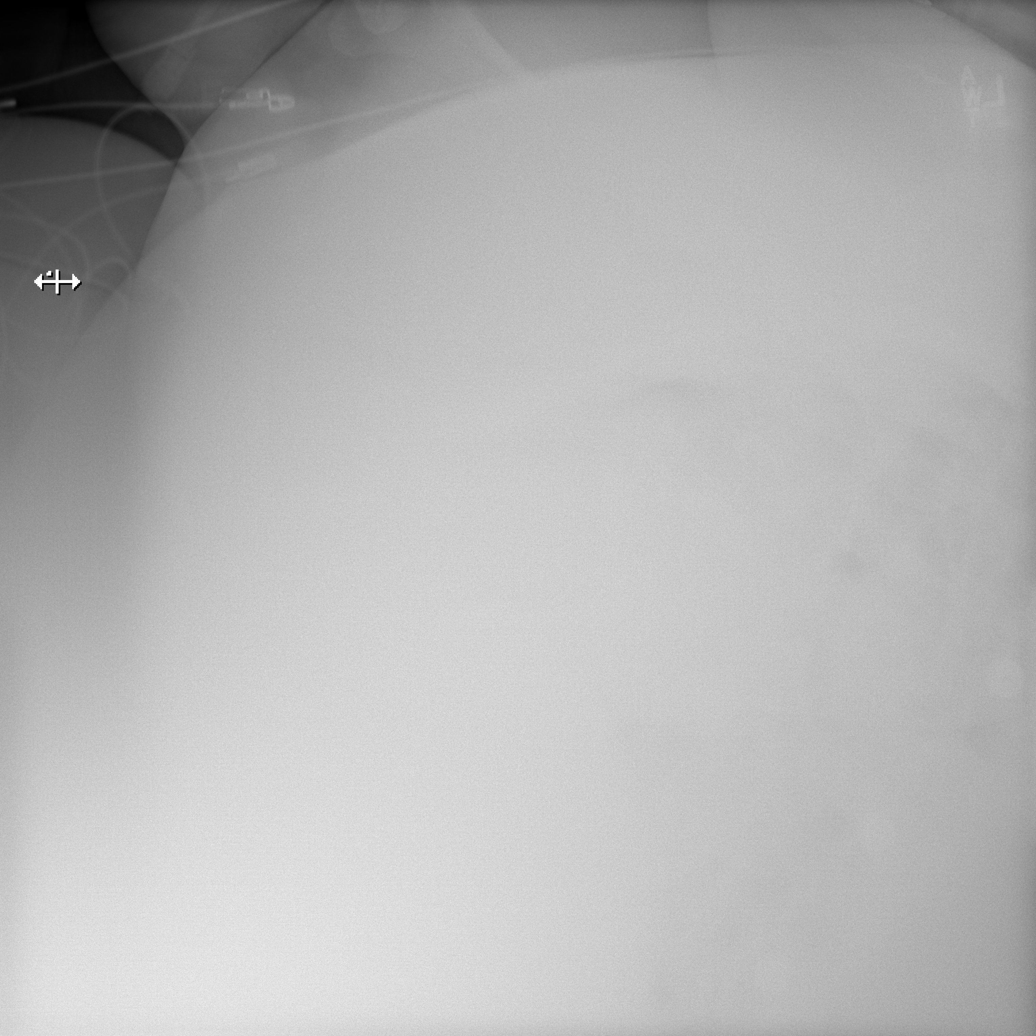

[x chest ap (1 of 2)]
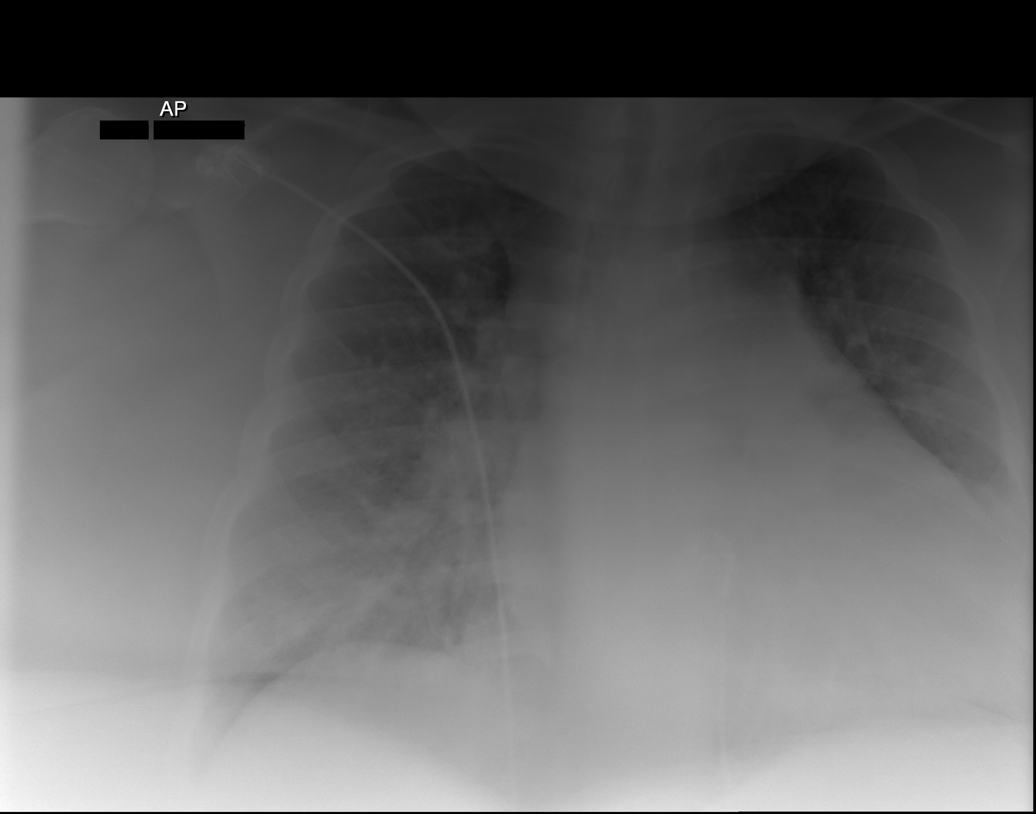

[x chest ap (2 of 2)]
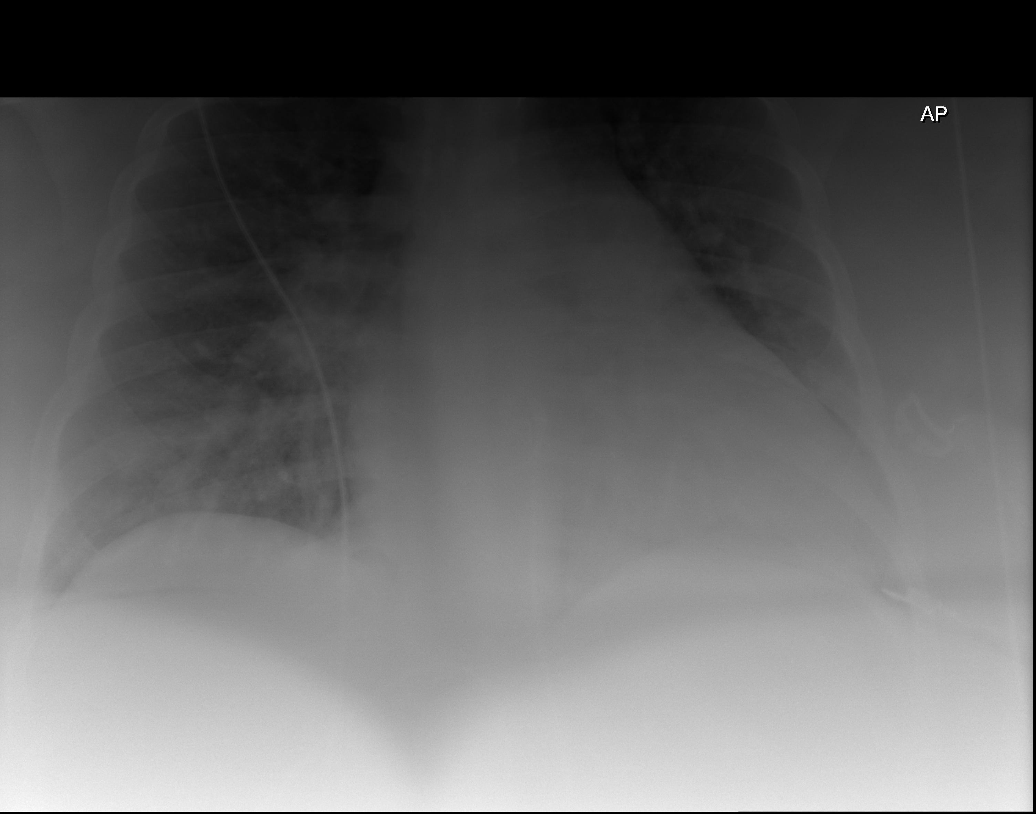

[4 of 4 positions shown; findings below may reference images not displayed]

FINDINGS: Trachea is midline. Heart is enlarged. Lungs are grossly clear. No
definite pleural fluid. Lateral view is nondiagnostic due to body
habitus.
IMPRESSION: No definite acute findings.
